# Patient Record
Sex: Male | Born: 1935 | Race: Black or African American | Hispanic: No | Marital: Married | Smoking: Former smoker
Health system: Southern US, Community
[De-identification: ages and names within clinical notes are randomized; demographics above are authoritative.]

## PROBLEM LIST (undated history)

## (undated) DIAGNOSIS — E7211 Homocystinuria: Secondary | ICD-10-CM

## (undated) DIAGNOSIS — C801 Malignant (primary) neoplasm, unspecified: Secondary | ICD-10-CM

## (undated) DIAGNOSIS — E785 Hyperlipidemia, unspecified: Secondary | ICD-10-CM

## (undated) DIAGNOSIS — I639 Cerebral infarction, unspecified: Secondary | ICD-10-CM

## (undated) DIAGNOSIS — I10 Essential (primary) hypertension: Secondary | ICD-10-CM

## (undated) DIAGNOSIS — I509 Heart failure, unspecified: Secondary | ICD-10-CM

## (undated) DIAGNOSIS — I214 Non-ST elevation (NSTEMI) myocardial infarction: Secondary | ICD-10-CM

## (undated) HISTORY — PX: OTHER SURGICAL HISTORY: SHX169

---

## 1998-06-14 ENCOUNTER — Observation Stay (HOSPITAL_COMMUNITY): Admission: EM | Admit: 1998-06-14 | Discharge: 1998-06-15 | Payer: Self-pay | Admitting: *Deleted

## 2003-05-22 ENCOUNTER — Emergency Department (HOSPITAL_COMMUNITY): Admission: EM | Admit: 2003-05-22 | Discharge: 2003-05-23 | Payer: Self-pay | Admitting: Emergency Medicine

## 2005-06-11 ENCOUNTER — Inpatient Hospital Stay (HOSPITAL_COMMUNITY): Admission: EM | Admit: 2005-06-11 | Discharge: 2005-06-13 | Payer: Self-pay | Admitting: Emergency Medicine

## 2005-06-12 ENCOUNTER — Encounter (INDEPENDENT_AMBULATORY_CARE_PROVIDER_SITE_OTHER): Payer: Self-pay | Admitting: *Deleted

## 2005-06-16 ENCOUNTER — Ambulatory Visit (HOSPITAL_COMMUNITY): Admission: RE | Admit: 2005-06-16 | Discharge: 2005-06-16 | Payer: Self-pay | Admitting: Cardiology

## 2005-06-16 ENCOUNTER — Ambulatory Visit: Payer: Self-pay | Admitting: Cardiology

## 2005-09-05 ENCOUNTER — Encounter: Admission: RE | Admit: 2005-09-05 | Discharge: 2005-09-05 | Payer: Self-pay | Admitting: Neurology

## 2006-05-26 ENCOUNTER — Encounter: Admission: RE | Admit: 2006-05-26 | Discharge: 2006-05-26 | Payer: Self-pay | Admitting: Cardiology

## 2007-01-01 ENCOUNTER — Encounter: Admission: RE | Admit: 2007-01-01 | Discharge: 2007-01-01 | Payer: Self-pay | Admitting: Cardiology

## 2010-09-04 ENCOUNTER — Inpatient Hospital Stay (HOSPITAL_COMMUNITY)
Admission: EM | Admit: 2010-09-04 | Discharge: 2010-09-10 | Payer: Self-pay | Source: Home / Self Care | Attending: Cardiology | Admitting: Cardiology

## 2010-09-04 LAB — CBC
HCT: 35.9 % — ABNORMAL LOW (ref 39.0–52.0)
Hemoglobin: 12.1 g/dL — ABNORMAL LOW (ref 13.0–17.0)
MCH: 30.8 pg (ref 26.0–34.0)
MCHC: 33.7 g/dL (ref 30.0–36.0)
MCV: 91.3 fL (ref 78.0–100.0)
Platelets: 173 10*3/uL (ref 150–400)
RBC: 3.93 MIL/uL — ABNORMAL LOW (ref 4.22–5.81)
RDW: 15 % (ref 11.5–15.5)
WBC: 6.6 10*3/uL (ref 4.0–10.5)

## 2010-09-04 LAB — DIFFERENTIAL
Basophils Absolute: 0 10*3/uL (ref 0.0–0.1)
Basophils Relative: 0 % (ref 0–1)
Eosinophils Absolute: 0 10*3/uL (ref 0.0–0.7)
Eosinophils Relative: 1 % (ref 0–5)
Lymphocytes Relative: 13 % (ref 12–46)
Lymphs Abs: 0.9 10*3/uL (ref 0.7–4.0)
Monocytes Absolute: 0.3 10*3/uL (ref 0.1–1.0)
Monocytes Relative: 5 % (ref 3–12)
Neutro Abs: 5.4 10*3/uL (ref 1.7–7.7)
Neutrophils Relative %: 81 % — ABNORMAL HIGH (ref 43–77)

## 2010-09-05 LAB — COMPREHENSIVE METABOLIC PANEL
ALT: 23 U/L (ref 0–53)
AST: 30 U/L (ref 0–37)
Albumin: 3.6 g/dL (ref 3.5–5.2)
Alkaline Phosphatase: 39 U/L (ref 39–117)
BUN: 39 mg/dL — ABNORMAL HIGH (ref 6–23)
CO2: 29 mEq/L (ref 19–32)
Calcium: 8.9 mg/dL (ref 8.4–10.5)
Chloride: 101 mEq/L (ref 96–112)
Creatinine, Ser: 2.65 mg/dL — ABNORMAL HIGH (ref 0.4–1.5)
GFR calc Af Amer: 28 mL/min — ABNORMAL LOW (ref >60)
GFR calc non Af Amer: 23 mL/min — ABNORMAL LOW (ref >60)
Glucose, Bld: 126 mg/dL — ABNORMAL HIGH (ref 70–99)
Potassium: 3.5 mEq/L (ref 3.5–5.1)
Sodium: 142 mEq/L (ref 135–145)
Total Bilirubin: 0.3 mg/dL (ref 0.3–1.2)
Total Protein: 6.9 g/dL (ref 6.0–8.3)

## 2010-09-05 LAB — HEPARIN LEVEL (UNFRACTIONATED): Heparin Unfractionated: 0.56 IU/mL (ref 0.30–0.70)

## 2010-09-05 LAB — URINALYSIS, ROUTINE W REFLEX MICROSCOPIC
Bilirubin Urine: NEGATIVE
Hemoglobin, Urine: NEGATIVE
Ketones, ur: NEGATIVE mg/dL
Nitrite: NEGATIVE
Protein, ur: NEGATIVE mg/dL
Specific Gravity, Urine: 1.017 (ref 1.005–1.030)
Urine Glucose, Fasting: NEGATIVE mg/dL
Urobilinogen, UA: 0.2 mg/dL (ref 0.0–1.0)
pH: 5.5 (ref 5.0–8.0)

## 2010-09-05 LAB — MAGNESIUM: Magnesium: 2.5 mg/dL (ref 1.5–2.5)

## 2010-09-05 LAB — PROTIME-INR
INR: 1.05 (ref 0.00–1.49)
Prothrombin Time: 13.9 seconds (ref 11.6–15.2)

## 2010-09-05 LAB — CK TOTAL AND CKMB (NOT AT ARMC)
CK, MB: 4.6 ng/mL — ABNORMAL HIGH (ref 0.3–4.0)
Relative Index: 2.1 (ref 0.0–2.5)
Total CK: 215 U/L (ref 7–232)

## 2010-09-05 LAB — CARDIAC PANEL(CRET KIN+CKTOT+MB+TROPI)
CK, MB: 6.5 ng/mL (ref 0.3–4.0)
CK, MB: 7.5 ng/mL (ref 0.3–4.0)
Relative Index: 2.5 (ref 0.0–2.5)
Relative Index: 2.3 (ref 0.0–2.5)
Total CK: 265 U/L — ABNORMAL HIGH (ref 7–232)
Total CK: 327 U/L — ABNORMAL HIGH (ref 7–232)
Troponin I: 0.1 ng/mL — ABNORMAL HIGH (ref 0.00–0.06)
Troponin I: 0.08 ng/mL — ABNORMAL HIGH (ref 0.00–0.06)

## 2010-09-05 LAB — LIPID PANEL
Cholesterol: 158 mg/dL (ref 0–200)
HDL: 92 mg/dL (ref >39)
LDL Cholesterol: 62 mg/dL (ref 0–99)
Total CHOL/HDL Ratio: 1.7 RATIO
Triglycerides: 19 mg/dL (ref <150)
VLDL: 4 mg/dL (ref 0–40)

## 2010-09-05 LAB — LIPASE, BLOOD: Lipase: 31 U/L (ref 11–59)

## 2010-09-05 LAB — TROPONIN I: Troponin I: 0.07 ng/mL — ABNORMAL HIGH (ref 0.00–0.06)

## 2010-09-06 LAB — CBC
HCT: 36.8 % — ABNORMAL LOW (ref 39.0–52.0)
Hemoglobin: 12.6 g/dL — ABNORMAL LOW (ref 13.0–17.0)
MCH: 31.4 pg (ref 26.0–34.0)
MCHC: 34.2 g/dL (ref 30.0–36.0)
MCV: 91.8 fL (ref 78.0–100.0)
Platelets: 205 10*3/uL (ref 150–400)
RBC: 4.01 MIL/uL — ABNORMAL LOW (ref 4.22–5.81)
RDW: 15.1 % (ref 11.5–15.5)
WBC: 6.1 10*3/uL (ref 4.0–10.5)

## 2010-09-06 LAB — HEPARIN LEVEL (UNFRACTIONATED): Heparin Unfractionated: 0.64 IU/mL (ref 0.30–0.70)

## 2010-09-16 LAB — CARDIAC PANEL(CRET KIN+CKTOT+MB+TROPI)
CK, MB: 7.6 ng/mL (ref 0.3–4.0)
CK, MB: 6.1 ng/mL (ref 0.3–4.0)
CK, MB: 7 ng/mL (ref 0.3–4.0)
CK, MB: 6.7 ng/mL (ref 0.3–4.0)
Relative Index: 1.8 (ref 0.0–2.5)
Relative Index: 1.7 (ref 0.0–2.5)
Relative Index: 1.7 (ref 0.0–2.5)
Relative Index: 1.9 (ref 0.0–2.5)
Total CK: 415 U/L — ABNORMAL HIGH (ref 7–232)
Total CK: 356 U/L — ABNORMAL HIGH (ref 7–232)
Total CK: 416 U/L — ABNORMAL HIGH (ref 7–232)
Total CK: 346 U/L — ABNORMAL HIGH (ref 7–232)
Troponin I: 0.11 ng/mL — ABNORMAL HIGH (ref 0.00–0.06)
Troponin I: 0.1 ng/mL — ABNORMAL HIGH (ref 0.00–0.06)
Troponin I: 0.1 ng/mL — ABNORMAL HIGH (ref 0.00–0.06)
Troponin I: 0.11 ng/mL — ABNORMAL HIGH (ref 0.00–0.06)

## 2010-09-16 LAB — CBC
HCT: 40.2 % (ref 39.0–52.0)
HCT: 42.5 % (ref 39.0–52.0)
HCT: 36.5 % — ABNORMAL LOW (ref 39.0–52.0)
HCT: 35.7 % — ABNORMAL LOW (ref 39.0–52.0)
Hemoglobin: 13.9 g/dL (ref 13.0–17.0)
Hemoglobin: 14.7 g/dL (ref 13.0–17.0)
Hemoglobin: 12.4 g/dL — ABNORMAL LOW (ref 13.0–17.0)
Hemoglobin: 12.2 g/dL — ABNORMAL LOW (ref 13.0–17.0)
MCH: 31.4 pg (ref 26.0–34.0)
MCH: 31.6 pg (ref 26.0–34.0)
MCH: 30.9 pg (ref 26.0–34.0)
MCH: 31 pg (ref 26.0–34.0)
MCHC: 34.6 g/dL (ref 30.0–36.0)
MCHC: 34.6 g/dL (ref 30.0–36.0)
MCHC: 34 g/dL (ref 30.0–36.0)
MCHC: 34.2 g/dL (ref 30.0–36.0)
MCV: 91 fL (ref 78.0–100.0)
MCV: 91.4 fL (ref 78.0–100.0)
MCV: 91 fL (ref 78.0–100.0)
MCV: 90.8 fL (ref 78.0–100.0)
Platelets: 199 10*3/uL (ref 150–400)
Platelets: 195 10*3/uL (ref 150–400)
Platelets: 181 10*3/uL (ref 150–400)
Platelets: 194 10*3/uL (ref 150–400)
RBC: 4.42 MIL/uL (ref 4.22–5.81)
RBC: 4.65 MIL/uL (ref 4.22–5.81)
RBC: 4.01 MIL/uL — ABNORMAL LOW (ref 4.22–5.81)
RBC: 3.93 MIL/uL — ABNORMAL LOW (ref 4.22–5.81)
RDW: 15.1 % (ref 11.5–15.5)
RDW: 15.2 % (ref 11.5–15.5)
RDW: 15.2 % (ref 11.5–15.5)
RDW: 15.4 % (ref 11.5–15.5)
WBC: 7.5 10*3/uL (ref 4.0–10.5)
WBC: 5.6 10*3/uL (ref 4.0–10.5)
WBC: 5.5 10*3/uL (ref 4.0–10.5)
WBC: 4.6 10*3/uL (ref 4.0–10.5)

## 2010-09-16 LAB — HEPARIN LEVEL (UNFRACTIONATED)
Heparin Unfractionated: 0.43 IU/mL (ref 0.30–0.70)
Heparin Unfractionated: 0.37 IU/mL (ref 0.30–0.70)
Heparin Unfractionated: 0.28 IU/mL — ABNORMAL LOW (ref 0.30–0.70)
Heparin Unfractionated: <0.1 IU/mL — ABNORMAL LOW (ref 0.30–0.70)

## 2010-10-09 NOTE — Discharge Summary (Signed)
Mark Yang, Mark Yang                  ACCOUNT NO.:  [id number]  MEDICAL RECORD NO.:  [id number]          PATIENT TYPE:  INP  LOCATION:  3709                         FACILITY:  [facility]  PHYSICIAN:  Osvaldo Shipper. Jos Cygan, M.D.DATE OF BIRTH:  Apr 04, 1918  DATE OF ADMISSION:  09/04/2010 DATE OF DISCHARGE:  09/10/2010                              DISCHARGE SUMMARY   DISCHARGE DIAGNOSES: 1. Non-ST-segment elevation myocardial infarction. 2. Hypertension. 3. Degenerative joint disease.  Mr. Rana is a 77 year old patient who was seen in the emergency department of the [facility] on September 04, 2010, with a complaint of nausea and vomiting.  The patient has some problems with dementia but stated that he felt okay upon arrival to the emergency department.  His family however said that the patient "looked bad." Evaluation in the emergency department and review of old records revealed a new bradycardia with a slightly low blood pressure.  In the emergency department, his complete blood count was nonacute, the CK-MB revealed total CK to be 215, the CK-MB was 4.5 and the relative index was 2.1.  The comprehensive metabolic panel revealed glucose to be slightly elevated at 126, BUN 39, and the creatinine at 2.6, the lipase was normal.  The troponin was elevated at 0.07.  The chest x-ray revealed some left hemidiaphragm elevation, otherwise nonacute.  The patient was subsequently admitted for a non-Q-wave nonSTEMI.  The electrocardiogram revealed a septal infarct of questionable age, no ST elevation.  There was noted a bradycardia.  The patient was seen by Cardiology.  It was Dr. Verl Dicker opinion that the patient's age and also having had a history of recent stroke that perhaps interventional procedure should be reserved for the patient's comfort only, otherwise, medical management should be done.  The patient was seen by the cardiac rehab team.  The patient showed steady uphill improvement  with no complaint of chest pain.  He did have an episode of sinus tach, but he came back to his baseline rhythm without requiring interventions.  The patient complained of some left leg pain and this was addressed.  Serial markers and EKGs were obtained, and there was no severe progression of acuity.  On September 10, 2010, it was the opinion that the patient had received maximum benefit from this hospitalization and could be discharged home. Plans were made for home health intervention.  The patient is to be seen in the office in 2 weeks or sooner if any changes, problems, or concerns.  MEDICATIONS AT DISCHARGE: 1. Tylenol 325 mg one every 6 hours as needed. 2. Aspirin 81 mg daily. 3. Clonidine 0.1 mg daily. 4. Nitroglycerin 0.4 mg sublingual every 5 minutes as needed up to 3     tablets. 5. Aggrenox one twice a day. 6. Aricept 5 mg daily. 7. Ativan 1 mg three times daily as needed. 8. Lasix 40 mg daily for swelling as needed. 9. Namenda 10 mg twice daily. 10.Zocor 20 mg daily.  The patient is to notify the physician immediately if any changes, problems, or concerns.     Ivery Quale, P.A.   ______________________________ Osvaldo Shipper. Amora Sheehy,  M.D.    HB/MEDQ  D:  09/26/2010  T:  09/26/2010  Job:  [id number]  Electronically Signed by Ivery Quale P.A. on 10/01/2010 01:31:11 PM Electronically Signed by Donia Guiles M.D. on 10/09/2010 07:25:48 PM

## 2011-01-17 NOTE — H&P (Signed)
NAMEKEVONTAY, BURKS                  ACCOUNT NO.:  [id number]   MEDICAL RECORD NO.:  [id number]          PATIENT TYPE:  EMS   LOCATION:  MAJO                         FACILITY:  [facility]   PHYSICIAN:  Bevelyn Buckles. Champey, M.D.DATE OF BIRTH:  [birth year]   DATE OF ADMISSION:  06/11/2005  DATE OF DISCHARGE:                                HISTORY & PHYSICAL   REFERRING PHYSICIAN:  Lavonda Jumbo, M.D.   CHIEF COMPLAINT:  Stroke.   HISTORY OF PRESENT ILLNESS:  Mr. Puccinelli is a 77 year old African-American  male with past medical history of hypertension who presents with a couple  day history of not feeling well, not being able to move his body well,  having some speech difficulty, and some visual disturbance, mostly blurred  vision.  The patient also has felt generalized fatigue and had slight  confusion over the past couple of days.  He has no problems with  comprehension of speech or conversation.  On occasion, he will have  difficulty getting words out.  The patient denies any headaches, focal  weakness, numbness, swelling, problems with vertigo, or loss of  consciousness.   PAST MEDICAL HISTORY:  Positive for hypertension.   MEDICATIONS:  Primatene/hydrochlorothiazide 37.5/25 one tablet per day.   ALLERGIES:  No known drug allergies.   FAMILY HISTORY:  He currently lives with his wife and son.   REVIEW OF SYSTEMS:  Positive as per HPI.  Otherwise negative as per HPI.  Greater than seven other organ systems.   PHYSICAL EXAMINATION:  VITAL SIGNS:  Blood pressure 158/76, pulse 55.  HEENT:  Normocephalic and atraumatic.  Extraocular muscles are intact.  Pupils equal, round, and reactive to light.  Face is symmetric.  HEART:  Regular.  LUNGS:  Clear.  ABDOMEN:  Soft and nontender.  EXTREMITIES:  Good pulses.  NEUROLOGY:  The patient is alert and oriented x2, not to year which he  thinks is 77.  Language is fine.  Cranial nerves II-XII grossly intact.  Motor examination shows 4+/5  strength throughout and normal tone throughout.  No drift is noted.  Sensory examination is within normal limits to light  touch and pinprick throughout. Reflexes are trace to 1+ throughout.  The  toes are downgoing bilaterally.  Cerebellar function is within normal  limits.  Finger-to-nose.  Gait was not assessed secondary to safety.   CT scan of the head showed a left acute PCA infarct with questionable small  amount of blood with old left parietal infarct.   LABORATORY DATA:  WBC 6.2, hemoglobin 16.3, hematocrit 40.2, platelets 239.  PT 13.8, INR 1.0, PTT 28.  Sodium 135, potassium 3.5, chloride 104, BUN 19,  creatinine 1.4, glucose 109.  Myoglobin 4.55.  CK 5.5, troponin is less than  0.05.   IMPRESSION:  This is a 77 year old with an acute left PCA infarct.  We will  admit the patient to stroke service, do an MRI and MRA of the brain, check  carotid Dopplers and two-dimensional echocardiogram.  We will check his  fasting lipids and homocysteine levels.  We will keep the patient  NPO until  speech clears him and place the patient on IV fluids at 100 mL an hour.  Get  PT, OT, and speech consults.  We will continue his current medications and  place the patient on DVT prophylaxis.           ______________________________  Bevelyn Buckles. Nash Shearer, M.D.     DRC/MEDQ  D:  06/11/2005  T:  06/12/2005  Job:  [id number]

## 2011-01-17 NOTE — Discharge Summary (Signed)
Mark Yang, Mark Yang                  ACCOUNT NO.:  [id number]   MEDICAL RECORD NO.:  [id number]          PATIENT TYPE:  INP   LOCATION:  3004                         FACILITY:  [facility]   PHYSICIAN:  Pramod P. Pearlean Brownie, MD    DATE OF BIRTH:  10/27/1917   DATE OF ADMISSION:  06/11/2005  DATE OF DISCHARGE:  06/15/2005                                 DISCHARGE SUMMARY   DISCHARGE DIAGNOSES:  1.  Left posterior cerebral artery infarct, likely intracranial      atherosclerosis.  2.  Hypertension.  3.  Dyslipidemia.  4.  Hyperhomocysteinemia.  5.  Cigarette smoking.   DISCHARGE MEDICATIONS:  1.  Clonidine 0.2 mg twice daily.  2.  Triamterene/hydrochlorothiazide 37.5/25 mg daily.  3.  Zocor 20 mg daily.  4.  Foltx 1 daily.  5.  Aggrenox 1 p.o. daily x2 weeks, then increase to twice daily.   STUDIES:  1.  CT of the brain on admission showed a moderate to large acute possibly      minimally hemorrhagic with left occipital infarct with adjacent local      mass effect.  Moderate infarct parietal lobe with encephalomalacia.      Atrophy.  Small vessel disease. Nonspecific bony lesion left frontal      calvarium and left frontal calvarium.  2.  MRI of the brain shows a medial left occipital lobe infarct consistent      with PCA territory.  Additional punctate areas of restrictive effusion      the left thalamus. Remote infarct left parietal lobe.  Atrophy      appropriate for patient's age.  Focal area within the area of the left      occipital lobe infarct may represent a tiny focus of subarachnoid blood      versus calcification.  3.  MRA of the brain shows left anterior cerebral artery P2 occlusion,      attenuated flow within the distal right PCA and right MCA branches.  4.  EKG shows sinus bradycardia.  5.  Carotid Doppler is normal.  6.  Transcranial Doppler not performed.  7.  2-D echocardiogram with EF 50 to 55%, no embolic source.  8.  Laboratory studies: Cholesterol 212,  triglycerides 88, HDL 80, LDL 114.      Homocysteine 25.15.  Urinalysis: Small bilirubin, otherwise normal.      CBC: Normal except for mildly elevated RDW at 15. Differential normal.      Chemistries: Glucose 109, creatinine 1.4.  Myoglobin high on admission      in the 450 to 500 range x3.  Otherwise, cardiac enzymes normal.   HISTORY OF PRESENT ILLNESS:  Mark Yang is an 77 year old, right-handed,  African-American male with history of hypertension who presents with a  couple of days history of not feeling well, not being able to move his body  without having speech difficulty as well as visual disturbance. The patient  has also felt fatigued and had confusion over the past few days.  He has had  no problems with comprehension, speech, or understanding. He occasionally  will have difficulty getting words out.  Neurology was asked to see him in  the emergency room as CT showed an infarct. The patient was admitted to the  hospital for further stroke evaluation.   HOSPITAL COURSE:  MRI did reveal acute left PCA infarct and small thalamic  infarct.  Stroke was felt to be atherosclerotic from risk factors of  dyslipidemia, hyperhomocysteinemia, smoking, and hypertension.  There was  some concern for cardioembolic source and will schedule patient for an  outpatient TEE the week after discharge.  The patient definitely has some  left visual field cut and mild weakness for which PT, OT, and speech therapy  are recommended at home.  He lives with his wife and son and plans to return  there.  Foltx and Zocor were added for risk factors of dyslipidemia and  hyperhomocysteinemia. The patient was placed on Aggrenox for secondary  stroke prevention.   CONDITION ON DISCHARGE:  Alert and oriented to person and place, occasional  confusion. He has fluent speech with occasional word hesitancy.  He is not  using paraphasias or neologisms.  He does have a dense right homonymous  hemianopsia.  He has  no facial oxymetry. He has no upper extremity drift and  no focal weakness in his lower extremities.   DISCHARGE PLAN:  1.  Discharge home to care of wife and son.  2.  Home health PT, OT, and speech therapies.  3.  Aggrenox for secondary stroke prevention.  4.  Outpatient TEE arranged with [facility] for week following      discharge.  5.  The patient is instructed not to drive.  6.  Follow up with Dr. Pearlean Brownie in 2 to 3 months.  7.  Follow up with primary care physician in 1 month.      Annie Main, N.P.    ______________________________  [name], MD    SB/MEDQ  D:  06/13/2005  T:  06/13/2005  Job:  [id number]   cc:   Osvaldo Shipper. Spruill, M.D.  Fax: [phone]   [facility]

## 2011-11-14 IMAGING — CR DG TIBIA/FIBULA 2V*L*
4 series · 4 of 4 positions shown · non-contrast
Comparison: None

CLINICAL DATA: Left lower leg pain.

LEFT TIBIA AND FIBULA - 2 VIEW

[t tib/fib ap left (1 of 2)]
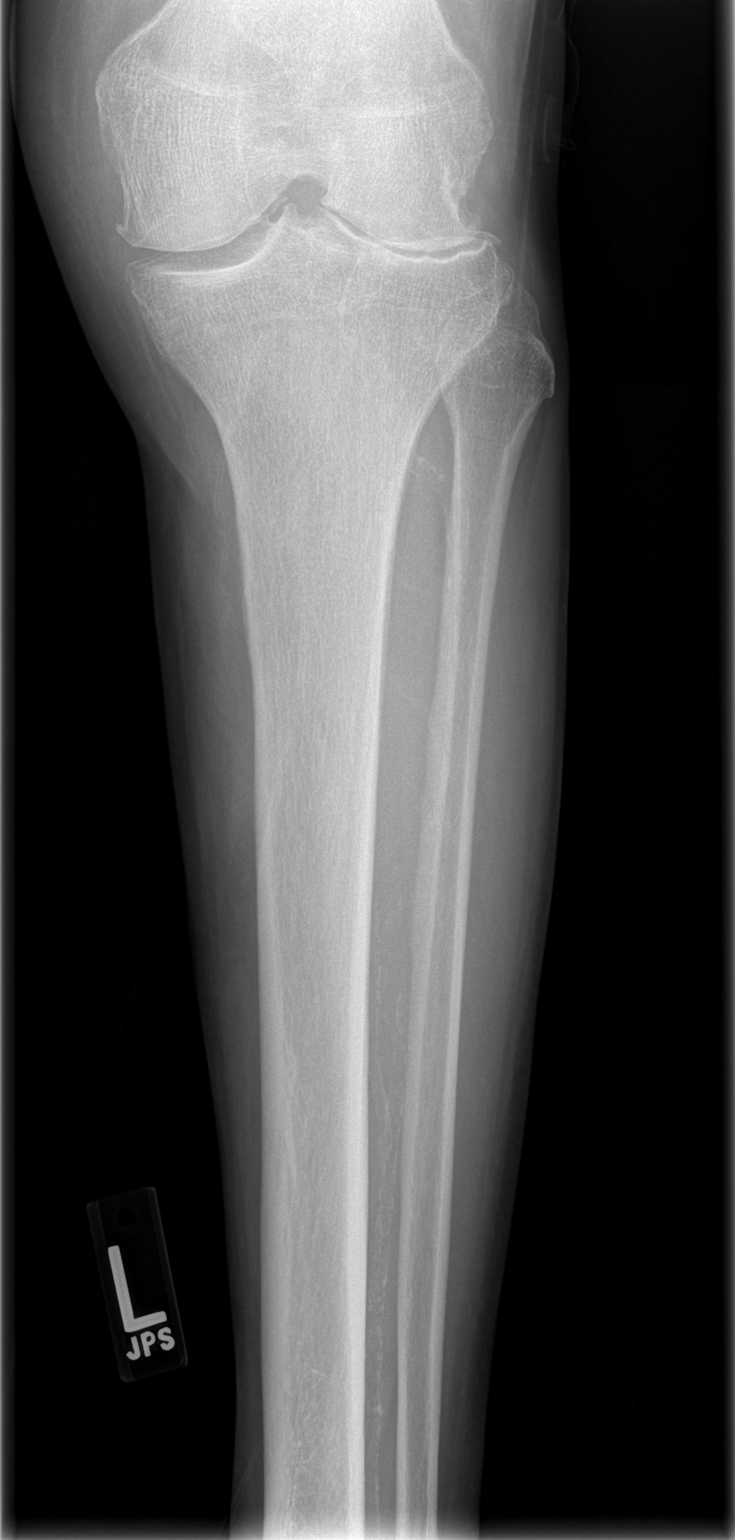

[t tib/fib ap left (2 of 2)]
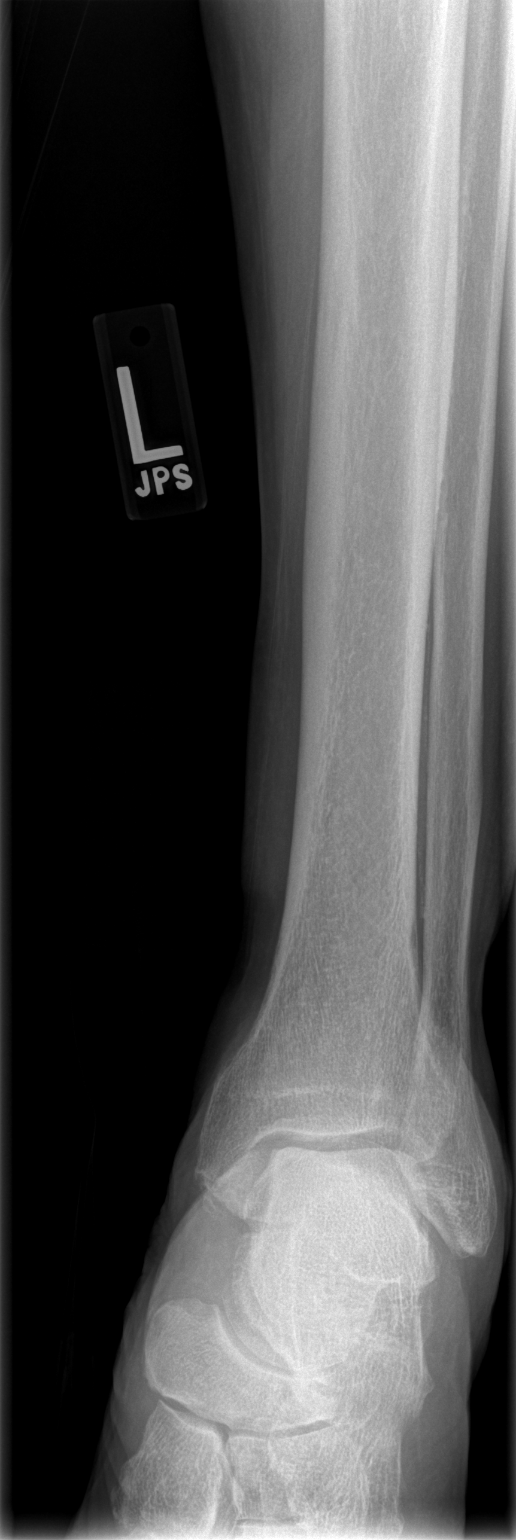

[t tib/fib lat left (1 of 2)]
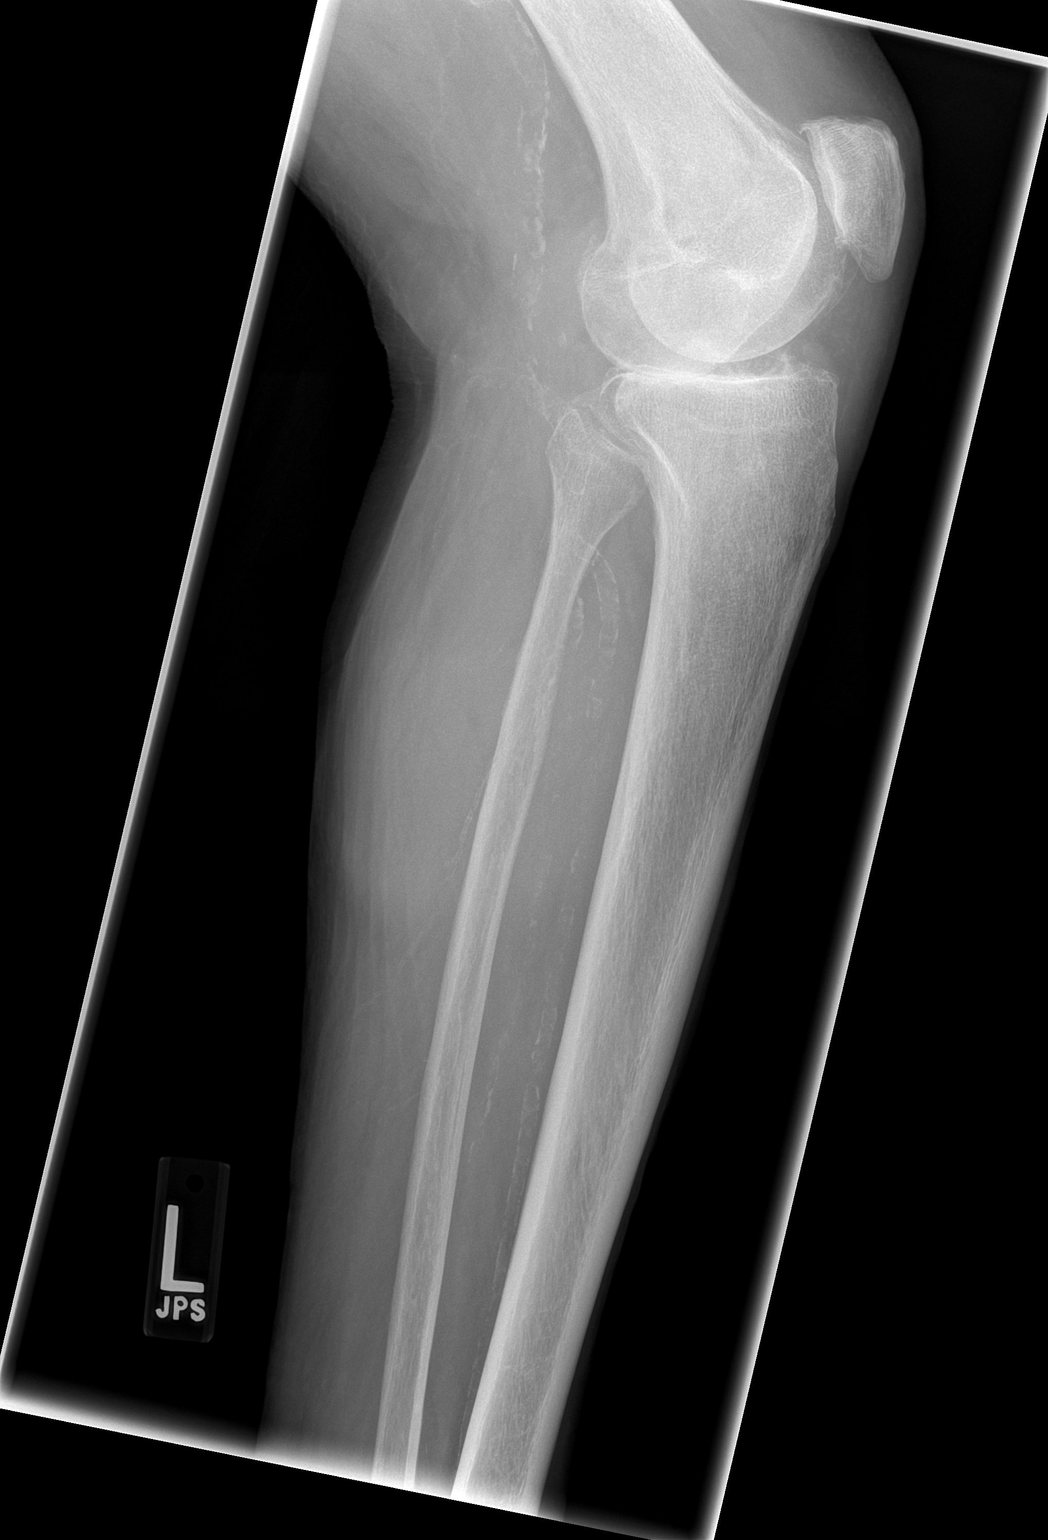

[t tib/fib lat left (2 of 2)]
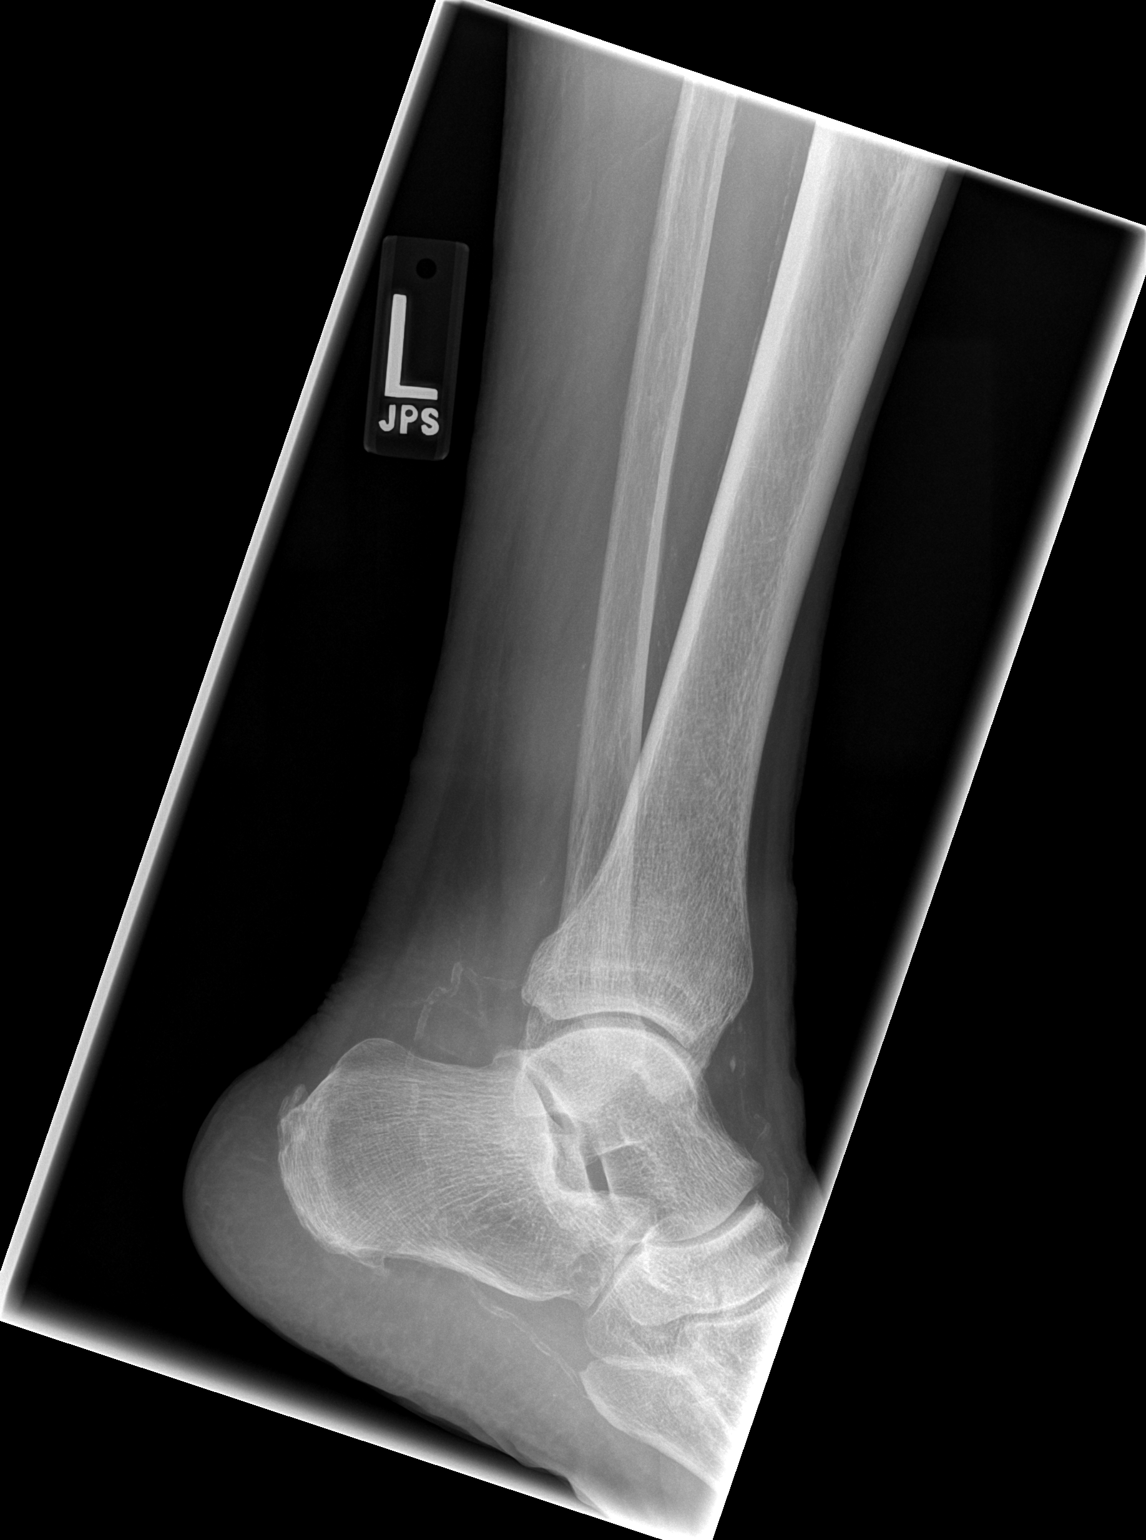

[4 of 4 positions shown; findings below may reference images not displayed]

FINDINGS: Moderately advanced lateral compartment degenerative
change noted at the knee.  Vascular calcifications are noted.  No
acute bony findings or destructive bony changes.
IMPRESSION: 1.  No acute bony findings.
2.  Extensive arterial calcifications.
3.  Significant lateral compartment degenerative change at the
knee.

## 2012-07-22 ENCOUNTER — Other Ambulatory Visit: Payer: Self-pay | Admitting: Cardiovascular Disease

## 2012-07-22 DIAGNOSIS — R319 Hematuria, unspecified: Secondary | ICD-10-CM

## 2012-07-23 ENCOUNTER — Ambulatory Visit
Admission: RE | Admit: 2012-07-23 | Discharge: 2012-07-23 | Disposition: A | Discharge status: Home / Self Care | Payer: No Typology Code available for payment source | Source: Ambulatory Visit | Attending: Cardiovascular Disease | Admitting: Cardiovascular Disease

## 2012-07-23 DIAGNOSIS — R319 Hematuria, unspecified: Secondary | ICD-10-CM

## 2013-05-19 ENCOUNTER — Other Ambulatory Visit: Payer: Self-pay | Admitting: Urology

## 2013-06-09 ENCOUNTER — Ambulatory Visit (HOSPITAL_COMMUNITY)
Admission: RE | Admit: 2013-06-09 | Payer: No Typology Code available for payment source | Source: Ambulatory Visit | Admitting: Urology

## 2013-06-09 ENCOUNTER — Encounter (HOSPITAL_COMMUNITY): Admission: RE | Payer: Self-pay | Source: Ambulatory Visit

## 2013-06-09 SURGERY — TRANSURETHRAL RESECTION OF BLADDER TUMOR WITH GYRUS (TURBT-GYRUS)
Anesthesia: Choice

## 2013-06-14 ENCOUNTER — Other Ambulatory Visit: Payer: Self-pay | Admitting: Urology

## 2013-07-04 ENCOUNTER — Ambulatory Visit (HOSPITAL_COMMUNITY)
Admission: RE | Admit: 2013-07-04 | Payer: No Typology Code available for payment source | Source: Ambulatory Visit | Admitting: Urology

## 2013-07-04 ENCOUNTER — Encounter (HOSPITAL_COMMUNITY): Admission: RE | Payer: Self-pay | Source: Ambulatory Visit

## 2013-07-04 SURGERY — TRANSURETHRAL RESECTION OF BLADDER TUMOR WITH GYRUS (TURBT-GYRUS)
Anesthesia: Choice

## 2014-05-20 ENCOUNTER — Inpatient Hospital Stay (HOSPITAL_COMMUNITY)
Admission: EM | Admit: 2014-05-20 | Discharge: 2014-05-22 | DRG: 670 | Disposition: A | Discharge status: Home / Self Care | Payer: Medicare Other | Attending: Urology | Admitting: Urology

## 2014-05-20 ENCOUNTER — Emergency Department (HOSPITAL_COMMUNITY): Payer: Medicare Other

## 2014-05-20 ENCOUNTER — Encounter (HOSPITAL_COMMUNITY): Payer: Self-pay | Admitting: Emergency Medicine

## 2014-05-20 DIAGNOSIS — R339 Retention of urine, unspecified: Secondary | ICD-10-CM

## 2014-05-20 DIAGNOSIS — C675 Malignant neoplasm of bladder neck: Secondary | ICD-10-CM

## 2014-05-20 DIAGNOSIS — R319 Hematuria, unspecified: Secondary | ICD-10-CM | POA: Diagnosis present

## 2014-05-20 DIAGNOSIS — I10 Essential (primary) hypertension: Secondary | ICD-10-CM | POA: Diagnosis present

## 2014-05-20 DIAGNOSIS — Z8673 Personal history of transient ischemic attack (TIA), and cerebral infarction without residual deficits: Secondary | ICD-10-CM

## 2014-05-20 DIAGNOSIS — Z8546 Personal history of malignant neoplasm of prostate: Secondary | ICD-10-CM

## 2014-05-20 DIAGNOSIS — E785 Hyperlipidemia, unspecified: Secondary | ICD-10-CM | POA: Diagnosis present

## 2014-05-20 DIAGNOSIS — I509 Heart failure, unspecified: Secondary | ICD-10-CM | POA: Diagnosis present

## 2014-05-20 DIAGNOSIS — Z79899 Other long term (current) drug therapy: Secondary | ICD-10-CM

## 2014-05-20 DIAGNOSIS — I252 Old myocardial infarction: Secondary | ICD-10-CM

## 2014-05-20 HISTORY — DX: Cerebral infarction, unspecified: I63.9

## 2014-05-20 HISTORY — DX: Homocystinuria: E72.11

## 2014-05-20 HISTORY — DX: Hyperlipidemia, unspecified: E78.5

## 2014-05-20 HISTORY — DX: Non-ST elevation (NSTEMI) myocardial infarction: I21.4

## 2014-05-20 HISTORY — DX: Heart failure, unspecified: I50.9

## 2014-05-20 HISTORY — DX: Essential (primary) hypertension: I10

## 2014-05-20 LAB — BASIC METABOLIC PANEL
Anion gap: 15 (ref 5–15)
BUN: 23 mg/dL (ref 6–23)
CO2: 25 meq/L (ref 19–32)
CREATININE: 1.3 mg/dL (ref 0.50–1.35)
Calcium: 9 mg/dL (ref 8.4–10.5)
Chloride: 103 mEq/L (ref 96–112)
GFR calc non Af Amer: 46 mL/min — ABNORMAL LOW (ref >90)
GFR, EST AFRICAN AMERICAN: 53 mL/min — AB (ref >90)
GLUCOSE: 137 mg/dL — AB (ref 70–99)
POTASSIUM: 3.7 meq/L (ref 3.7–5.3)
Sodium: 143 mEq/L (ref 137–147)

## 2014-05-20 LAB — CBC WITH DIFFERENTIAL/PLATELET
Basophils Absolute: 0 10*3/uL (ref 0.0–0.1)
Basophils Relative: 0 % (ref 0–1)
Eosinophils Absolute: 0 10*3/uL (ref 0.0–0.7)
Eosinophils Relative: 0 % (ref 0–5)
HEMATOCRIT: 33.6 % — AB (ref 39.0–52.0)
HEMOGLOBIN: 10.7 g/dL — AB (ref 13.0–17.0)
LYMPHS ABS: 0.4 10*3/uL — AB (ref 0.7–4.0)
LYMPHS PCT: 6 % — AB (ref 12–46)
MCH: 26.8 pg (ref 26.0–34.0)
MCHC: 31.8 g/dL (ref 30.0–36.0)
MCV: 84.2 fL (ref 78.0–100.0)
MONO ABS: 0.2 10*3/uL (ref 0.1–1.0)
Monocytes Relative: 3 % (ref 3–12)
NEUTROS ABS: 6.2 10*3/uL (ref 1.7–7.7)
NEUTROS PCT: 91 % — AB (ref 43–77)
Platelets: 238 10*3/uL (ref 150–400)
RBC: 3.99 MIL/uL — AB (ref 4.22–5.81)
RDW: 16.9 % — ABNORMAL HIGH (ref 11.5–15.5)
WBC: 6.8 10*3/uL (ref 4.0–10.5)

## 2014-05-20 LAB — PROTIME-INR
INR: 1.08 (ref 0.00–1.49)
Prothrombin Time: 14 seconds (ref 11.6–15.2)

## 2014-05-20 LAB — URINALYSIS, ROUTINE W REFLEX MICROSCOPIC
BILIRUBIN URINE: NEGATIVE
GLUCOSE, UA: NEGATIVE mg/dL
Ketones, ur: NEGATIVE mg/dL
Leukocytes, UA: NEGATIVE
Nitrite: NEGATIVE
Protein, ur: >300 mg/dL — AB
Specific Gravity, Urine: 1.018 (ref 1.005–1.030)
UROBILINOGEN UA: 1 mg/dL (ref 0.0–1.0)
pH: 6 (ref 5.0–8.0)

## 2014-05-20 LAB — URINE MICROSCOPIC-ADD ON

## 2014-05-20 MED ORDER — FERROUS SULFATE 325 (65 FE) MG PO TABS
325.0000 mg | ORAL_TABLET | Freq: Every day | ORAL | Status: DC
  Administered 2014-05-22: 325 mg via ORAL
  Filled 2014-05-20 (×3): qty 1

## 2014-05-20 MED ORDER — LORAZEPAM 1 MG PO TABS: 1.0000 mg | ORAL_TABLET | Freq: Every day | ORAL | Status: DC

## 2014-05-20 MED ORDER — CIPROFLOXACIN IN D5W 200 MG/100ML IV SOLN
200.0000 mg | Freq: Two times a day (BID) | INTRAVENOUS | Status: DC
  Administered 2014-05-20: 200 mg via INTRAVENOUS
  Filled 2014-05-20 (×2): qty 100

## 2014-05-20 MED ORDER — FUROSEMIDE 40 MG PO TABS
40.0000 mg | ORAL_TABLET | Freq: Every day | ORAL | Status: DC
  Administered 2014-05-20 – 2014-05-22 (×2): 40 mg via ORAL
  Filled 2014-05-20 (×3): qty 1

## 2014-05-20 MED ORDER — STERILE WATER FOR INJECTION IJ SOLN
INTRAMUSCULAR | Status: AC
  Administered 2014-05-20: 15:00:00
  Filled 2014-05-20: qty 30

## 2014-05-20 MED ORDER — CLONIDINE HCL 0.1 MG PO TABS
0.1000 mg | ORAL_TABLET | Freq: Every day | ORAL | Status: DC
  Administered 2014-05-20 – 2014-05-22 (×2): 0.1 mg via ORAL
  Filled 2014-05-20 (×3): qty 1

## 2014-05-20 MED ORDER — DONEPEZIL HCL 5 MG PO TABS
5.0000 mg | ORAL_TABLET | Freq: Every day | ORAL | Status: DC
Start: 2014-05-20 — End: 2014-05-22
  Administered 2014-05-20 – 2014-05-21 (×2): 5 mg via ORAL
  Filled 2014-05-20 (×3): qty 1

## 2014-05-20 MED ORDER — ADULT MULTIVITAMIN W/MINERALS CH
1.0000 | ORAL_TABLET | Freq: Every day | ORAL | Status: DC
  Administered 2014-05-20 – 2014-05-22 (×2): 1 via ORAL
  Filled 2014-05-20 (×3): qty 1

## 2014-05-20 MED ORDER — DOCUSATE SODIUM 100 MG PO CAPS
100.0000 mg | ORAL_CAPSULE | Freq: Two times a day (BID) | ORAL | Status: DC
  Administered 2014-05-20 – 2014-05-22 (×3): 100 mg via ORAL
  Filled 2014-05-20 (×5): qty 1

## 2014-05-20 MED ORDER — BELLADONNA ALKALOIDS-OPIUM 16.2-60 MG RE SUPP
1.0000 | Freq: Once | RECTAL | Status: AC
  Administered 2014-05-20: 1 via RECTAL
  Filled 2014-05-20: qty 1

## 2014-05-20 MED ORDER — MEMANTINE HCL ER 7 MG PO CP24
28.0000 mg | ORAL_CAPSULE | Freq: Every day | ORAL | Status: DC
  Administered 2014-05-20 – 2014-05-22 (×2): 28 mg via ORAL
  Filled 2014-05-20 (×3): qty 4

## 2014-05-20 MED ORDER — ACETAMINOPHEN 325 MG PO TABS: 650.0000 mg | ORAL_TABLET | ORAL | Status: DC | PRN

## 2014-05-20 MED ORDER — CETYLPYRIDINIUM CHLORIDE 0.05 % MT LIQD
7.0000 mL | Freq: Two times a day (BID) | OROMUCOSAL | Status: DC
  Administered 2014-05-20 – 2014-05-22 (×3): 7 mL via OROMUCOSAL

## 2014-05-20 MED ORDER — SIMVASTATIN 20 MG PO TABS
20.0000 mg | ORAL_TABLET | Freq: Every day | ORAL | Status: DC
Start: 2014-05-20 — End: 2014-05-22
  Administered 2014-05-20 – 2014-05-21 (×2): 20 mg via ORAL
  Filled 2014-05-20 (×3): qty 1

## 2014-05-20 NOTE — Consult Note (Signed)
Urology Consult   Physician requesting consult: Dr. Audie Pinto, ED  Reason for consult: Hematuria, urinary retention  History of Present Illness: Mark Yang is a 80 y.o. 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 Diona Fanti. A TURBT was recommended at that time for palliative purposes, but the family elected to forgo that given the patient's age and frailty.   He presents today with subrapubic discomfort and inability to urinate. He had been passing clots for the last few days and was occasionally straining, but he was able to get his stream going up until this morning. Bedside ultrasound in the ED revealed either a mass or clot in the bladder. A 22Fr 3 way foley catheter was placed and 312mL of red urine returned without clots. Unable to irrigate catheter so urology was consulted.   The catheter was replaced with a 24Fr large mouth coude hematuria catheter and irrigated to clear. His bladder capacity is extremely small with inability to get more than 60cc in his bladder before becoming uncomfortable. No clots could be irrigated and he was able to be cleared about about 1L of irrigation, however the drainage became red rather quickly.   Past Medical History  Diagnosis Date  . CHF (congestive heart failure)   . NSTEMI (non-ST elevated myocardial infarction)   . Hypertension   . Stroke   . Dyslipidemia   . Hyperhomocysteinemia   Prostate cancer Bladder cancer  No past surgical history on file.  Medications:  Home meds:    Medication List    ASK your doctor about these medications       cloNIDine 0.1 MG tablet  Commonly known as:  CATAPRES  Take 0.1 mg by mouth daily.     donepezil 5 MG tablet  Commonly known as:  ARICEPT  Take 5 mg by mouth at bedtime.     ferrous sulfate 325 (65 FE) MG tablet  Take 325  mg by mouth daily with breakfast.     furosemide 40 MG tablet  Commonly known as:  LASIX  Take 40 mg by mouth daily.     LORazepam 1 MG tablet  Commonly known as:  ATIVAN  Take 1 mg by mouth daily.     multivitamin with minerals Tabs tablet  Take 1 tablet by mouth daily.     NAMENDA XR 28 MG Cp24  Generic drug:  Memantine HCl ER  Take by mouth.     simvastatin 20 MG tablet  Commonly known as:  ZOCOR  Take 20 mg by mouth daily.     STOOL SOFTENER PO  Take 1 capsule by mouth daily as needed (constipation.).     VITAMIN C PO  Take 1 tablet by mouth daily.        Scheduled Meds: Continuous Infusions: PRN Meds:.    Allergies: No Known Allergies  No family history on file.  Social History:  has no tobacco, alcohol, and drug history on file.  ROS: A complete review of systems was performed.  All systems are negative except for pertinent findings as noted.  Physical Exam:  Vital signs in last 24 hours: Temp:  [97.4 F (36.3 C)] 97.4 F (36.3 C) (09/19 1301) Pulse Rate:  [83] 83 (09/19 1301) Resp:  [18] 18 (09/19 1301) BP: (173)/(76) 173/76 mmHg (09/19 1301) SpO2:  [97 %] 97 % (09/19 1301)  Constitutional:  Alert and oriented, No acute distress Cardiovascular: Regular rate and rhythm, No JVD Respiratory: Normal respiratory effort, Lungs clear bilaterally GI: Abdomen is soft, nontender, nondistended, no abdominal masses Genitourinary: No CVAT. Normal circumcised. male phallus, testes are descended bilaterally and non-tender and without masses, scrotum is normal in appearance without lesions or masses, perineum is normal on inspection. Urine as above in HPI. Lymphatic: No lymphadenopathy Neurologic: Grossly intact, no focal deficits Psychiatric: Normal mood and affect  Laboratory Data:   Recent Labs  05/20/14 1603  WBC 6.8  HGB 10.7*  HCT 33.6*  PLT 238    No results found for this basename: NA, K, CL, CO3, GLUCOSE, BUN, CALCIUM, CREATININE,  in the last  72 hours   Results for orders placed during the hospital encounter of 05/20/14 (from the past 24 hour(s))  URINALYSIS, ROUTINE W REFLEX MICROSCOPIC     Status: Abnormal   Collection Time    05/20/14  2:14 PM      Result Value Ref Range   Color, Urine YELLOW  YELLOW   APPearance CLOUDY (*) CLEAR   Specific Gravity, Urine 1.018  1.005 - 1.030   pH 6.0  5.0 - 8.0   Glucose, UA NEGATIVE  NEGATIVE mg/dL   Hgb urine dipstick LARGE (*) NEGATIVE   Bilirubin Urine NEGATIVE  NEGATIVE   Ketones, ur NEGATIVE  NEGATIVE mg/dL   Protein, ur >300 (*) NEGATIVE mg/dL   Urobilinogen, UA 1.0  0.0 - 1.0 mg/dL   Nitrite NEGATIVE  NEGATIVE   Leukocytes, UA NEGATIVE  NEGATIVE  URINE MICROSCOPIC-ADD ON     Status: None   Collection Time    05/20/14  2:14 PM      Result Value Ref Range   RBC / HPF TOO NUMEROUS TO COUNT  <3 RBC/hpf   Urine-Other FIELD OBSCURED BY RBC'S     No results found for this or any previous visit (from the past 240 hour(s)).  Renal Function: No results found for this basename: CREATININE,  in the last 168 hours CrCl is unknown because there is no height on file for the current visit.  Radiologic Imaging: No results found.  I independently reviewed the above imaging studies.  Impression/Recommendation: 24M with known history of gross hematuria and possible clot retention vs obstructing tumor. He has a history of prostate cancer treated with brachytherapy in 2007 and a known bladder tumor at the bladder neck as recently as 05/2013 seen on cystoscopy but the bladder could not be fully evaluated due to hematuria. Family hoping to avoid OR if at all possible.   -- admit to urology for observation -- f/u formal bladder ultrasound - no significant clot/mass on preliminary read. No hydronephrosis -- gentle CBI overnight -- will decide on palliative clot evac and TURBT vs continued foley drainage  Addendum  I performed a history and physical examination of the patient and discussed  his management with the resident.  I reviewed the resident's note and agree with the documented findings and plan of care.  I spoke at length with the patient's daughter. The patient's attending urologist will be away next week. They will have to make a decision with the patient as to whether he would be agreeable to any surgical intervention. If he persists in having hematuria we would suggest consideration for cystoscopy with possible TURBT and fulguration next week. If no intervention is accepted then we would want to get a palliative care consultation and since he cannot just stay in the  hospital having ongoing hematuria. We will reassess the degree hematuria in the a.m. If it is acceptable for discharge we'll send him home probably with a Foley catheter. If he has ongoing hematuria we will discuss further the possibility of surgical intervention on Monday.

## 2014-05-20 NOTE — ED Provider Notes (Signed)
CSN: [id number]     Arrival date & time 05/20/14  1253 History   First MD Initiated Contact with Patient 05/20/14 1307     Chief Complaint  Patient presents with  . Urinary Retention      HPI Pt in from home. Has cyst on bladder. No surgery done due to age. Normally has blood in urine. Did pass a blood clot just PTA but still unable to urinate. Hx CHF, lower extremity edema, worse today according to family.  Past Medical History  Diagnosis Date  . CHF (congestive heart failure)   . NSTEMI (non-ST elevated myocardial infarction)   . Hypertension   . Stroke   . Dyslipidemia   . Hyperhomocysteinemia    No past surgical history on file. No family history on file. History  Substance Use Topics  . Smoking status: Not on file  . Smokeless tobacco: Not on file  . Alcohol Use: Not on file    Review of Systems  Unable to perform ROS: Dementia      Allergies  Review of patient's allergies indicates no known allergies.  Home Medications   Prior to Admission medications   Medication Sig Start Date End Date Taking? Authorizing Provider  Ascorbic Acid (VITAMIN C PO) Take 1 tablet by mouth daily.   Yes Historical Provider, MD  cloNIDine (CATAPRES) 0.1 MG tablet Take 0.1 mg by mouth daily.   Yes Historical Provider, MD  Docusate Calcium (STOOL SOFTENER PO) Take 1 capsule by mouth daily as needed (constipation.).   Yes Historical Provider, MD  donepezil (ARICEPT) 5 MG tablet Take 5 mg by mouth at bedtime.   Yes Historical Provider, MD  ferrous sulfate 325 (65 FE) MG tablet Take 325 mg by mouth daily with breakfast.   Yes Historical Provider, MD  furosemide (LASIX) 40 MG tablet Take 40 mg by mouth daily.   Yes Historical Provider, MD  LORazepam (ATIVAN) 1 MG tablet Take 1 mg by mouth daily.   Yes Historical Provider, MD  Memantine HCl ER (NAMENDA XR) 28 MG CP24 Take by mouth.   Yes Historical Provider, MD  Multiple Vitamin (MULTIVITAMIN WITH MINERALS) TABS tablet Take 1 tablet by  mouth daily.   Yes Historical Provider, MD  simvastatin (ZOCOR) 20 MG tablet Take 20 mg by mouth daily.   Yes Historical Provider, MD   BP 126/66  Pulse 60  Temp(Src) 98.4 F (36.9 C) (Oral)  Resp 20  Ht 5\' 9"  (1.753 m)  Wt 147 lb 14.9 oz (67.1 kg)  BMI 21.84 kg/m2  SpO2 98% Physical Exam  Nursing note and vitals reviewed. Constitutional: He appears well-developed and well-nourished. No distress.  HENT:  Head: Normocephalic and atraumatic.  Eyes: Pupils are equal, round, and reactive to light.  Neck: Normal range of motion.  Cardiovascular: Normal rate.   Pulmonary/Chest: No respiratory distress.  Abdominal: Normal appearance. He exhibits distension. There is tenderness (Over the bladder).  Musculoskeletal: Normal range of motion.  Neurological: He is alert. No cranial nerve deficit.  Skin: Skin is warm and dry. No rash noted.  Psychiatric: He has a normal mood and affect.    ED Course  Procedures (including critical care time) Foley catheter placed and proximally 500-600 cc of bloody urine was drained.  Urology was consulted and saw the patient emergency room..  The catheter.  Recommended lab work and ultrasound done.  They will reevaluate after results have returned. Labs Review Labs Reviewed  URINALYSIS, ROUTINE W REFLEX MICROSCOPIC - Abnormal; Notable for the  following:    Color, Urine RED (*)    APPearance CLOUDY (*)    Hgb urine dipstick LARGE (*)    Protein, ur >300 (*)    All other components within normal limits  CBC WITH DIFFERENTIAL - Abnormal; Notable for the following:    RBC 3.99 (*)    Hemoglobin 10.7 (*)    HCT 33.6 (*)    RDW 16.9 (*)    Neutrophils Relative % 91 (*)    Lymphocytes Relative 6 (*)    Lymphs Abs 0.4 (*)    All other components within normal limits  BASIC METABOLIC PANEL - Abnormal; Notable for the following:    Glucose, Bld 137 (*)    GFR calc non Af Amer 46 (*)    GFR calc Af Amer 53 (*)    All other components within normal limits   BASIC METABOLIC PANEL - Abnormal; Notable for the following:    Glucose, Bld 121 (*)    Creatinine, Ser 1.36 (*)    GFR calc non Af Amer 43 (*)    GFR calc Af Amer 50 (*)    All other components within normal limits  CBC - Abnormal; Notable for the following:    RBC 3.88 (*)    Hemoglobin 10.2 (*)    HCT 32.8 (*)    RDW 17.2 (*)    All other components within normal limits  URINE CULTURE  URINE MICROSCOPIC-ADD ON  PROTIME-INR    Imaging Review US Renal  05/20/2014   CLINICAL DATA:  Hematuria.  EXAM: RENAL/URINARY TRACT ULTRASOUND COMPLETE  COMPARISON:  Renal ultrasound 07/23/2012  FINDINGS: Right Kidney:  Length: 9.7 cm. No hydronephrosis. Mild increased renal parenchymal echogenicity.  Left Kidney:  Length: 9.7 cm. No hydronephrosis. Mild increased renal parenchymal echogenicity.  Bladder:  Decompressed with Foley catheter.  IMPRESSION: No hydronephrosis.  Mild increased renal cortical echogenicity as can be seen with chronic medical renal disease.   Electronically Signed   By: Lovey Newcomer M.D.   On: 05/20/2014 17:04    Urology was consulted.  Dr. Wynetta Emery saw the patient and irrigated his bladder.  Patient was turned over to Dr. Venora Maples for final disposition.  Patient seemed to be improved with less pain.  MDM   Final diagnoses:  Urinary retention  Hematuria        Dot Lanes, MD 05/21/14 [phone]

## 2014-05-20 NOTE — ED Notes (Signed)
Attempted to call report to floor, requests call back. Spoke with Dr. Wynetta Emery re: pt's pain after attempting CBI with no output after 50-37ml infusion. Able to manually irrigate with effort to get back slightly less than what was manually put in. Per Wynetta Emery, MD, most likely bladder spasms, orders received.

## 2014-05-20 NOTE — ED Notes (Signed)
Pt in from home. Has cyst on bladder. No surgery done due to age. Normally has blood in urine. Did pass a blood clot just PTA but still unable to urinate. Hx CHF, lower extremity edema, worse today according to family.

## 2014-05-20 NOTE — ED Notes (Signed)
Bed: PP89 Expected date: 05/20/14 Expected time: 12:48 PM Means of arrival: Ambulance Comments: Urinary retention

## 2014-05-20 NOTE — Progress Notes (Signed)
Pt arrived to unit on stretcher, slid to bed w/ 3 assist. Foley w/ CBI clamped, draining bright red urine. Pt denies pain/SP tenderness at present. Oriented to callbell and environment, POC discussed. Family bedside.

## 2014-05-20 NOTE — ED Notes (Signed)
Pt's foley clamped off for short period per [place] request for full bladder for proper scan.

## 2014-05-21 ENCOUNTER — Encounter (HOSPITAL_COMMUNITY)
Admission: EM | Disposition: A | Discharge status: Home / Self Care | Payer: Self-pay | Source: Home / Self Care | Attending: Urology

## 2014-05-21 ENCOUNTER — Observation Stay (HOSPITAL_COMMUNITY): Payer: Medicare Other | Admitting: Anesthesiology

## 2014-05-21 ENCOUNTER — Encounter (HOSPITAL_COMMUNITY): Payer: Self-pay | Admitting: *Deleted

## 2014-05-21 ENCOUNTER — Encounter (HOSPITAL_COMMUNITY): Payer: Medicare Other | Admitting: Anesthesiology

## 2014-05-21 DIAGNOSIS — C675 Malignant neoplasm of bladder neck: Secondary | ICD-10-CM | POA: Diagnosis present

## 2014-05-21 DIAGNOSIS — I10 Essential (primary) hypertension: Secondary | ICD-10-CM | POA: Diagnosis present

## 2014-05-21 DIAGNOSIS — I509 Heart failure, unspecified: Secondary | ICD-10-CM | POA: Diagnosis present

## 2014-05-21 DIAGNOSIS — I252 Old myocardial infarction: Secondary | ICD-10-CM | POA: Diagnosis not present

## 2014-05-21 DIAGNOSIS — Z79899 Other long term (current) drug therapy: Secondary | ICD-10-CM | POA: Diagnosis not present

## 2014-05-21 DIAGNOSIS — Z8673 Personal history of transient ischemic attack (TIA), and cerebral infarction without residual deficits: Secondary | ICD-10-CM | POA: Diagnosis not present

## 2014-05-21 DIAGNOSIS — R319 Hematuria, unspecified: Secondary | ICD-10-CM | POA: Diagnosis present

## 2014-05-21 DIAGNOSIS — Z8546 Personal history of malignant neoplasm of prostate: Secondary | ICD-10-CM | POA: Diagnosis not present

## 2014-05-21 DIAGNOSIS — E785 Hyperlipidemia, unspecified: Secondary | ICD-10-CM | POA: Diagnosis present

## 2014-05-21 HISTORY — PX: CYSTO: SHX6284

## 2014-05-21 HISTORY — PX: TRANSURETHRAL RESECTION OF BLADDER TUMOR WITH GYRUS (TURBT-GYRUS): SHX6458

## 2014-05-21 LAB — BASIC METABOLIC PANEL
Anion gap: 13 (ref 5–15)
BUN: 22 mg/dL (ref 6–23)
CALCIUM: 9.1 mg/dL (ref 8.4–10.5)
CO2: 26 mEq/L (ref 19–32)
Chloride: 103 mEq/L (ref 96–112)
Creatinine, Ser: 1.36 mg/dL — ABNORMAL HIGH (ref 0.50–1.35)
GFR, EST AFRICAN AMERICAN: 50 mL/min — AB (ref >90)
GFR, EST NON AFRICAN AMERICAN: 43 mL/min — AB (ref >90)
GLUCOSE: 121 mg/dL — AB (ref 70–99)
Potassium: 3.9 mEq/L (ref 3.7–5.3)
Sodium: 142 mEq/L (ref 137–147)

## 2014-05-21 LAB — CBC
HCT: 32.8 % — ABNORMAL LOW (ref 39.0–52.0)
Hemoglobin: 10.2 g/dL — ABNORMAL LOW (ref 13.0–17.0)
MCH: 26.3 pg (ref 26.0–34.0)
MCHC: 31.1 g/dL (ref 30.0–36.0)
MCV: 84.5 fL (ref 78.0–100.0)
PLATELETS: 224 10*3/uL (ref 150–400)
RBC: 3.88 MIL/uL — ABNORMAL LOW (ref 4.22–5.81)
RDW: 17.2 % — AB (ref 11.5–15.5)
WBC: 7.9 10*3/uL (ref 4.0–10.5)

## 2014-05-21 LAB — MRSA PCR SCREENING: MRSA BY PCR: NEGATIVE

## 2014-05-21 SURGERY — CYSTO
Anesthesia: General | Site: Bladder

## 2014-05-21 MED ORDER — SODIUM CHLORIDE 0.9 % IJ SOLN
INTRAMUSCULAR | Status: AC
  Filled 2014-05-21: qty 10

## 2014-05-21 MED ORDER — FENTANYL CITRATE 0.05 MG/ML IJ SOLN
INTRAMUSCULAR | Status: AC
  Filled 2014-05-21: qty 2

## 2014-05-21 MED ORDER — PROPOFOL 10 MG/ML IV BOLUS
INTRAVENOUS | Status: DC | PRN
  Administered 2014-05-21: 120 mg via INTRAVENOUS

## 2014-05-21 MED ORDER — SODIUM CHLORIDE 0.9 % IR SOLN
3000.0000 mL | Status: DC
  Administered 2014-05-21 (×2): 3000 mL

## 2014-05-21 MED ORDER — LIDOCAINE HCL 2 % EX GEL
CUTANEOUS | Status: AC
  Filled 2014-05-21: qty 10

## 2014-05-21 MED ORDER — LACTATED RINGERS IV SOLN
INTRAVENOUS | Status: DC | PRN
  Administered 2014-05-21: 13:00:00 via INTRAVENOUS

## 2014-05-21 MED ORDER — STERILE WATER FOR IRRIGATION IR SOLN
Status: DC | PRN
  Administered 2014-05-21: 1000 mL

## 2014-05-21 MED ORDER — BACITRACIN ZINC 500 UNIT/GM EX OINT
TOPICAL_OINTMENT | CUTANEOUS | Status: AC
  Filled 2014-05-21: qty 28.35

## 2014-05-21 MED ORDER — CHLORHEXIDINE GLUCONATE CLOTH 2 % EX PADS
6.0000 | MEDICATED_PAD | Freq: Every day | CUTANEOUS | Status: DC
  Administered 2014-05-21 – 2014-05-22 (×2): 6 via TOPICAL

## 2014-05-21 MED ORDER — PROPOFOL 10 MG/ML IV BOLUS
INTRAVENOUS | Status: AC
  Filled 2014-05-21: qty 20

## 2014-05-21 MED ORDER — ONDANSETRON HCL 4 MG/2ML IJ SOLN
INTRAMUSCULAR | Status: DC | PRN
  Administered 2014-05-21: 4 mg via INTRAVENOUS

## 2014-05-21 MED ORDER — CIPROFLOXACIN IN D5W 400 MG/200ML IV SOLN
400.0000 mg | Freq: Two times a day (BID) | INTRAVENOUS | Status: DC
  Administered 2014-05-21 – 2014-05-22 (×3): 400 mg via INTRAVENOUS
  Filled 2014-05-21 (×4): qty 200

## 2014-05-21 MED ORDER — EPHEDRINE SULFATE 50 MG/ML IJ SOLN
INTRAMUSCULAR | Status: AC
  Filled 2014-05-21: qty 1

## 2014-05-21 MED ORDER — FENTANYL CITRATE 0.05 MG/ML IJ SOLN: 25.0000 ug | INTRAMUSCULAR | Status: DC | PRN

## 2014-05-21 MED ORDER — BACITRACIN ZINC 500 UNIT/GM EX OINT
TOPICAL_OINTMENT | CUTANEOUS | Status: DC | PRN
  Administered 2014-05-21: 1 via TOPICAL

## 2014-05-21 MED ORDER — SODIUM CHLORIDE 0.9 % IR SOLN
Status: DC | PRN
  Administered 2014-05-21: 12000 mL via INTRAVESICAL

## 2014-05-21 MED ORDER — LIDOCAINE HCL 2 % IJ SOLN
INTRAMUSCULAR | Status: DC | PRN
  Administered 2014-05-21: 2 mg

## 2014-05-21 MED ORDER — BELLADONNA ALKALOIDS-OPIUM 16.2-60 MG RE SUPP
RECTAL | Status: AC
  Filled 2014-05-21: qty 1

## 2014-05-21 MED ORDER — FENTANYL CITRATE 0.05 MG/ML IJ SOLN
INTRAMUSCULAR | Status: DC | PRN
  Administered 2014-05-21 (×3): 12.5 ug via INTRAVENOUS
  Administered 2014-05-21: 50 ug via INTRAVENOUS
  Administered 2014-05-21: 12.5 ug via INTRAVENOUS

## 2014-05-21 MED ORDER — DEXAMETHASONE SODIUM PHOSPHATE 10 MG/ML IJ SOLN
INTRAMUSCULAR | Status: DC | PRN
  Administered 2014-05-21: 10 mg via INTRAVENOUS

## 2014-05-21 MED ORDER — EPHEDRINE SULFATE 50 MG/ML IJ SOLN
INTRAMUSCULAR | Status: DC | PRN
  Administered 2014-05-21: 10 mg via INTRAVENOUS
  Administered 2014-05-21: 5 mg via INTRAVENOUS

## 2014-05-21 MED ORDER — BELLADONNA ALKALOIDS-OPIUM 16.2-60 MG RE SUPP
RECTAL | Status: DC | PRN
  Administered 2014-05-21: 1 via RECTAL

## 2014-05-21 SURGICAL SUPPLY — 13 items
BAG URO CATCHER STRL LF (DRAPE) ×2 IMPLANT
CATH HEMA 3WAY 30CC 24FR COUDE (CATHETERS) ×2 IMPLANT
DRSG MEPILEX BORDER 4X4 (GAUZE/BANDAGES/DRESSINGS) ×2 IMPLANT
ELECT LOOP MED HF 24F 12D CBL (CLIP) ×2 IMPLANT
GLOVE BIOGEL M 7.0 STRL (GLOVE) ×4 IMPLANT
GLOVE SURG SS PI 6.5 STRL IVOR (GLOVE) ×4 IMPLANT
GOWN STRL REUS W/TWL XL LVL3 (GOWN DISPOSABLE) ×4 IMPLANT
KIT ASPIRATION TUBING (SET/KITS/TRAYS/PACK) ×2 IMPLANT
MANIFOLD NEPTUNE II (INSTRUMENTS) ×2 IMPLANT
PACK CYSTO (CUSTOM PROCEDURE TRAY) ×2 IMPLANT
SYRINGE 35CC LL (MISCELLANEOUS) ×2 IMPLANT
SYRINGE IRR TOOMEY STRL 70CC (SYRINGE) ×2 IMPLANT
TUBING IRRIGATION (MISCELLANEOUS) ×2 IMPLANT

## 2014-05-21 NOTE — Anesthesia Preprocedure Evaluation (Addendum)
Anesthesia Evaluation  Patient identified by MRN, date of birth, ID band Patient awake    Reviewed: Allergy & Precautions, H&P , NPO status , Patient's Chart, lab work & pertinent test results  Airway Mallampati: II TM Distance: >3 FB Neck ROM: Full    Dental no notable dental hx.    Pulmonary former smoker,  breath sounds clear to auscultation  Pulmonary exam normal       Cardiovascular hypertension, Pt. on medications + Past MI and +CHF Rhythm:Regular Rate:Normal  ECHO 2006 with good EF and mild AI  Non Q wave MI in 2012   Neuro/Psych CVA negative psych ROS   GI/Hepatic negative GI ROS, Neg liver ROS,   Endo/Other  negative endocrine ROS  Renal/GU Renal InsufficiencyRenal diseaseCr 1.36 K 3.9  negative genitourinary   Musculoskeletal negative musculoskeletal ROS (+)   Abdominal   Peds negative pediatric ROS (+)  Hematology negative hematology ROS (+) anemia , Hgb 10.2   Anesthesia Other Findings   Reproductive/Obstetrics negative OB ROS                         Anesthesia Physical Anesthesia Plan  ASA: III  Anesthesia Plan: General   Post-op Pain Management:    Induction: Intravenous  Airway Management Planned: LMA  Additional Equipment:   Intra-op Plan:   Post-operative Plan: Extubation in OR  Informed Consent: I have reviewed the patients History and Physical, chart, labs and discussed the procedure including the risks, benefits and alternatives for the proposed anesthesia with the patient or authorized representative who has indicated his/her understanding and acceptance.   Dental advisory given  Plan Discussed with: CRNA  Anesthesia Plan Comments:         Anesthesia Quick Evaluation

## 2014-05-21 NOTE — Transfer of Care (Signed)
Immediate Anesthesia Transfer of Care Note  Patient: Mark Yang  Procedure(s) Performed: Procedure(s): CYSTO (N/A) TRANSURETHRAL RESECTION OF BLADDER TUMOR WITH GYRUS WITH CLOT EVACUATION (N/A)  Patient Location: PACU  Anesthesia Type:General  Level of Consciousness: awake, alert  and patient cooperative  Airway & Oxygen Therapy: Patient Spontanous Breathing and Patient connected to face mask oxygen  Post-op Assessment: Report given to PACU RN and Post -op Vital signs reviewed and stable  Post vital signs: Reviewed and stable  Complications: No apparent anesthesia complications

## 2014-05-21 NOTE — Progress Notes (Signed)
UR completed 

## 2014-05-21 NOTE — Progress Notes (Signed)
  Subjective: NAEON. Could not tolerate CBI due to bladder spasms. Otherwise, no issues.   Objective: Vital signs in last 24 hours: Temp:  [97.4 F (36.3 C)-99 F (37.2 C)] 98.4 F (36.9 C) (09/20 0534) Pulse Rate:  [60-107] 60 (09/20 0534) Resp:  [16-20] 20 (09/20 0534) BP: (126-173)/(66-83) 126/66 mmHg (09/20 0534) SpO2:  [95 %-100 %] 98 % (09/20 0534) Weight:  [67.1 kg (147 lb 14.9 oz)] 67.1 kg (147 lb 14.9 oz) (09/19 1856)  Intake/Output from previous day: 09/19 0701 - 09/20 0700 In: 220 [P.O.:120; IV Piggyback:100] Out: 510 [Urine:510] Intake/Output this shift:    Physical Exam:  General: Alert and oriented CV: RRR Lungs: Clear Abdomen: Soft, ND. GU: Foley with dark red urine, no clots Ext: NT, No erythema  Lab Results:  Recent Labs  05/20/14 1603 05/21/14 0531  HGB 10.7* 10.2*  HCT 33.6* 32.8*   BMET  Recent Labs  05/20/14 1603 05/21/14 0531  NA 143 142  K 3.7 3.9  CL 103 103  CO2 25 26  GLUCOSE 137* 121*  BUN 23 22  CREATININE 1.30 1.36*  CALCIUM 9.0 9.1     Studies/Results: US Renal  05/20/2014   CLINICAL DATA:  Hematuria.  EXAM: RENAL/URINARY TRACT ULTRASOUND COMPLETE  COMPARISON:  Renal ultrasound 07/23/2012  FINDINGS: Right Kidney:  Length: 9.7 cm. No hydronephrosis. Mild increased renal parenchymal echogenicity.  Left Kidney:  Length: 9.7 cm. No hydronephrosis. Mild increased renal parenchymal echogenicity.  Bladder:  Decompressed with Foley catheter.  IMPRESSION: No hydronephrosis.  Mild increased renal cortical echogenicity as can be seen with chronic medical renal disease.   Electronically Signed   By: Lovey Newcomer M.D.   On: 05/20/2014 17:04    Assessment/Plan: 89M with history of PCa treated with brachy in 2007 with known bladder tumor and hematuria since at least 05/2013. Refused recommended palliative TURBT at that time. Now returns with continued gross hematuria and questionable clot retention. 24Fr 3-way hematuria catheter in place,  but cannot tolerate CBI.  Due to ongoing hematuria, discussed with family the rationale for palliative cysto, clot evac, possible TURBT/TURP to hopefully identify source of bleeding and control this. Risks and benefits discussed and consent was obtained by the daughter Alyce Pagan - [phone]).    LOS: 1 day   Aianna Fahs C 05/21/2014, 9:11 AM

## 2014-05-21 NOTE — Op Note (Signed)
Preoperative diagnosis:  1. Prostate cancer 2. Bladder cancer 3. Hematuria  Postoperative diagnosis: 1. Same  Procedure(s): 1. Cystoscopy, clot evacuation 2. Transurethral resection of bladder tumor > 5cm  Surgeon: Dr. Rana Snare  Anesthesia: General  Complications: None  EBL: Minimal  Specimens: Bladder tumor  Intraoperative findings: Extensive papillary and necrotic tumor circumferentially around the bladder neck, posterior bladder wall, and dome. Majority of tumor resected and cauterized. Bladder neck was clear at the end of the case. No large residual tumor was left.   Indication: 32M with history of prostate cancer treated with brachytherapy in 2007 and a papillary bladder tumor at the bladder neck noted in 2014. Patient and family electively deferred treatment at that time. Unfortunately, his urine has become increasingly bloody and went into clot retention on 9/19 which brought him to the OR. Unable to irrigate to clear urine due to low capacity bladder and likely contracted bladder due to radiation changes. After long discussion with family, elected to proceed with palliative cystoscopy, clot evacuation and possible TURBT/TURP for both diagnostic and therapeutic purposes.   Description of procedure:  Patient verified in holding. Informed consent had been signed. He was placed on the OR table and general anesthesia was induced. He was then placed in dorsal lithotomy and prepped and draped in the usual fashion. The cystoscope was placed into the bladder with the above noted findings. Clot was manually evacuated.   The resectoscope was then placed into the bladder and the extensive resection of bladder tumor was undertaken, primarily around the bladder neck and in multiple other areas on the bladder dome and posterior bladder wall. Total tumor volume was much greater than 5cm. Hemostasis was obtained. Chips were irrigated out. Inactive prostate seeds were visualized and irrigated  out.  A 24Fr 3 way hematuria foley catheter was replaced into the bladder and he was awoken from anesthesia having tolerated the procedure well.

## 2014-05-21 NOTE — Anesthesia Postprocedure Evaluation (Signed)
  Anesthesia Post-op Note  Patient: Mark Yang  Procedure(s) Performed: Procedure(s) (LRB): CYSTO (N/A) TRANSURETHRAL RESECTION OF BLADDER TUMOR WITH GYRUS WITH CLOT EVACUATION (N/A)  Patient Location: PACU  Anesthesia Type: General  Level of Consciousness: awake and alert   Airway and Oxygen Therapy: Patient Spontanous Breathing  Post-op Pain: mild  Post-op Assessment: Post-op Vital signs reviewed, Patient's Cardiovascular Status Stable, Respiratory Function Stable, Patent Airway and No signs of Nausea or vomiting  Last Vitals:  Filed Vitals:   05/21/14 1545  BP: 165/84  Pulse: 60  Temp: 36.7 C  Resp: 16    Post-op Vital Signs: stable   Complications: No apparent anesthesia complications

## 2014-05-22 ENCOUNTER — Encounter (HOSPITAL_COMMUNITY): Payer: Self-pay | Admitting: Urology

## 2014-05-22 DIAGNOSIS — C675 Malignant neoplasm of bladder neck: Secondary | ICD-10-CM

## 2014-05-22 LAB — URINE CULTURE
COLONY COUNT: NO GROWTH
Culture: NO GROWTH

## 2014-05-22 LAB — CBC
HCT: 30 % — ABNORMAL LOW (ref 39.0–52.0)
Hemoglobin: 9.5 g/dL — ABNORMAL LOW (ref 13.0–17.0)
MCH: 26.7 pg (ref 26.0–34.0)
MCHC: 31.7 g/dL (ref 30.0–36.0)
MCV: 84.3 fL (ref 78.0–100.0)
PLATELETS: 194 10*3/uL (ref 150–400)
RBC: 3.56 MIL/uL — ABNORMAL LOW (ref 4.22–5.81)
RDW: 17.2 % — AB (ref 11.5–15.5)
WBC: 7.4 10*3/uL (ref 4.0–10.5)

## 2014-05-22 LAB — BASIC METABOLIC PANEL
ANION GAP: 12 (ref 5–15)
BUN: 27 mg/dL — ABNORMAL HIGH (ref 6–23)
CALCIUM: 8.6 mg/dL (ref 8.4–10.5)
CO2: 24 mEq/L (ref 19–32)
CREATININE: 1.48 mg/dL — AB (ref 0.50–1.35)
Chloride: 104 mEq/L (ref 96–112)
GFR, EST AFRICAN AMERICAN: 45 mL/min — AB (ref >90)
GFR, EST NON AFRICAN AMERICAN: 39 mL/min — AB (ref >90)
Glucose, Bld: 134 mg/dL — ABNORMAL HIGH (ref 70–99)
Potassium: 4.3 mEq/L (ref 3.7–5.3)
Sodium: 140 mEq/L (ref 137–147)

## 2014-05-22 NOTE — Progress Notes (Signed)
CSW consulted for transportation needs. Patient will need non-emergency ambulance transport home. CSW confirmed home address with patient's daughters at bedside. [facility] called for transport. No other CSW needs identified - CSW signing off.   Raynaldo Opitz, [facility] Clinical Social Worker cell #: [phone]

## 2014-05-22 NOTE — Progress Notes (Signed)
Pt d/c in stable condition to home at this time by [facility]. D/C instructions reviewed w/ pt and both dtrs, all verbalized understanding. Foley care demonstrated and instructed to both dtrs and both verbalize comfort and competence w/ care. Pt d/c in possession of d/c instructions, urinal, and all personal belongings.

## 2014-05-22 NOTE — Discharge Summary (Signed)
Patient ID: JD MCCASTER MRN: [id number] DOB/AGE: March 20, 1918 80 y.o.  Admit date: 05/20/2014 Discharge date: 05/22/2014    Discharge Diagnoses:   Present on Admission:  . Hematuria  Consults:  None    Discharge Medications:   Medication List         cloNIDine 0.1 MG tablet  Commonly known as:  CATAPRES  Take 0.1 mg by mouth daily.     donepezil 5 MG tablet  Commonly known as:  ARICEPT  Take 5 mg by mouth at bedtime.     ferrous sulfate 325 (65 FE) MG tablet  Take 325 mg by mouth daily with breakfast.     furosemide 40 MG tablet  Commonly known as:  LASIX  Take 40 mg by mouth daily.     LORazepam 1 MG tablet  Commonly known as:  ATIVAN  Take 1 mg by mouth daily.     multivitamin with minerals Tabs tablet  Take 1 tablet by mouth daily.     NAMENDA XR 28 MG Cp24  Generic drug:  Memantine HCl ER  Take by mouth.     simvastatin 20 MG tablet  Commonly known as:  ZOCOR  Take 20 mg by mouth daily.     STOOL SOFTENER PO  Take 1 capsule by mouth daily as needed (constipation.).     VITAMIN C PO  Take 1 tablet by mouth daily.         Significant Diagnostic Studies:  US Renal  05/20/2014   CLINICAL DATA:  Hematuria.  EXAM: RENAL/URINARY TRACT ULTRASOUND COMPLETE  COMPARISON:  Renal ultrasound 07/23/2012  FINDINGS: Right Kidney:  Length: 9.7 cm. No hydronephrosis. Mild increased renal parenchymal echogenicity.  Left Kidney:  Length: 9.7 cm. No hydronephrosis. Mild increased renal parenchymal echogenicity.  Bladder:  Decompressed with Foley catheter.  IMPRESSION: No hydronephrosis.  Mild increased renal cortical echogenicity as can be seen with chronic medical renal disease.   Electronically Signed   By: Lovey Newcomer M.D.   On: 05/20/2014 17:04      Hospital Course:  Active Problems:   Hematuria   Malignant neoplasm of urinary bladder neck  Patient presented with months of gross hematuria. He has a prior history of prostate cancer status post brachii therapy.  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 The patient was noted to have severely grossly bloody urine and was admitted for observation and supportive care. Next morning the patient was taken to surgery where he underwent clot evacuation. He was noted to have extensive transitional cell carcinoma circumferentially around the bladder neck and involving the anterior wall the bladder. This was grossly completely resected. The patient remained stable and had no obvious complications. Urine was clear the next morning we felt the patient be discharged with an indwelling Foley catheter. Day of Discharge BP 115/61  Pulse 61  Temp(Src) 98 F (36.7 C) (Oral)  Resp 18  Ht 5\' 9"  (1.753 m)  Wt 67.1 kg (147 lb 14.9 oz)  BMI 21.84 kg/m2  SpO2 94%  Well-developed well-nourished elderly male. He continues to be disoriented and has severe dementia. Respiratory effort appears normal. Abdomen soft and nontender GU indwelling catheter draining clear yellow urine Extremities without edema  Results for orders placed during the hospital encounter of 05/20/14 (from the past 24 hour(s))  CBC     Status: Abnormal   Collection Time    05/22/14  4:20 AM  Result Value Ref Range   WBC 7.4  4.0 - 10.5 K/uL   RBC 3.56 (*) 4.22 - 5.81 MIL/uL   Hemoglobin 9.5 (*) 13.0 - 17.0 g/dL   HCT 30.0 (*) 39.0 - 52.0 %   MCV 84.3  78.0 - 100.0 fL   MCH 26.7  26.0 - 34.0 pg   MCHC 31.7  30.0 - 36.0 g/dL   RDW 17.2 (*) 11.5 - 15.5 %   Platelets 194  150 - 400 K/uL  BASIC METABOLIC PANEL     Status: Abnormal   Collection Time    05/22/14  4:20 AM      Result Value Ref Range   Sodium 140  137 - 147 mEq/L   Potassium 4.3  3.7 - 5.3 mEq/L   Chloride 104  96 - 112 mEq/L   CO2 24  19 - 32 mEq/L   Glucose, Bld 134 (*) 70 - 99 mg/dL   BUN 27 (*) 6 - 23 mg/dL   Creatinine, Ser 1.48 (*) 0.50 - 1.35 mg/dL   Calcium 8.6  8.4 - 10.5  mg/dL   GFR calc non Af Amer 39 (*) >90 mL/min   GFR calc Af Amer 45 (*) >90 mL/min   Anion gap 12  5 - 15

## 2014-05-22 NOTE — Discharge Instructions (Addendum)
Transurethral Resection of Bladder Tumor (TURBT)   Definition:  Transurethral Resection of the Bladder Tumor is a surgical procedure used to diagnose and remove tumors within the bladder. TURBT is the most common treatment for early stage bladder cancer.  General instructions:     Your recent bladder surgery requires very little post hospital care but some definite precautions.  Despite the fact that no skin incisions were used, the area around the bladder incisions are raw and covered with scabs to promote healing and prevent bleeding. Certain precautions are needed to insure that the scabs are not disturbed over the next 2-4 weeks while the healing proceeds.  Because the raw surface inside your bladder and the irritating effects of urine you may expect frequency of urination and/or urgency (a stronger desire to urinate) and perhaps even getting up at night more often. This will usually resolve or improve slowly over the healing period. You may see some blood in your urine over the first 6 weeks. Do not be alarmed, even if the urine was clear for a while. Get off your feet and drink lots of fluids until clearing occurs. If you start to pass clots or don't improve call us.  Diet:  You may return to your normal diet immediately. Because of the raw surface of your bladder, alcohol, spicy foods, foods high in acid and drinks with caffeine may cause irritation or frequency and should be used in moderation. To keep your urine flowing freely and avoid constipation, drink plenty of fluids during the day (8-10 glasses). Tip: Avoid cranberry juice because it is very acidic.  Activity:  Your physical activity doesn't need to be restricted. However, if you are very active, you may see some blood in the urine. We suggest that you reduce your activity under the circumstances until the bleeding has stopped.  Bowels:  It is important to keep your bowels regular during the postoperative period. Straining  with bowel movements can cause bleeding. A bowel movement every other day is reasonable. Use a mild laxative if needed, such as milk of magnesia 2-3 tablespoons, or 2 Dulcolax tablets. Call if you continue to have problems. If you had been taking narcotics for pain, before, during or after your surgery, you may be constipated. Take a laxative if necessary.    Medication:  You should resume your pre-surgery medications unless told not to. In addition you may be given an antibiotic to prevent or treat infection. Antibiotics are not always necessary. All medication should be taken as prescribed until the bottles are finished unless you are having an unusual reaction to one of the drugs.  We will call to set up a followup to remove the catheter in 5-7 days

## 2014-07-14 ENCOUNTER — Encounter (HOSPITAL_COMMUNITY): Payer: Self-pay

## 2014-07-14 ENCOUNTER — Emergency Department (HOSPITAL_COMMUNITY): Payer: Medicare Other

## 2014-07-14 ENCOUNTER — Inpatient Hospital Stay (HOSPITAL_COMMUNITY)
Admission: EM | Admit: 2014-07-14 | Discharge: 2014-07-16 | DRG: 689 | Disposition: A | Discharge status: Home / Self Care | Payer: Medicare Other | Attending: Internal Medicine | Admitting: Internal Medicine

## 2014-07-14 DIAGNOSIS — R531 Weakness: Secondary | ICD-10-CM

## 2014-07-14 DIAGNOSIS — Z8546 Personal history of malignant neoplasm of prostate: Secondary | ICD-10-CM

## 2014-07-14 DIAGNOSIS — R4182 Altered mental status, unspecified: Secondary | ICD-10-CM | POA: Diagnosis present

## 2014-07-14 DIAGNOSIS — I69351 Hemiplegia and hemiparesis following cerebral infarction affecting right dominant side: Secondary | ICD-10-CM | POA: Diagnosis not present

## 2014-07-14 DIAGNOSIS — I252 Old myocardial infarction: Secondary | ICD-10-CM

## 2014-07-14 DIAGNOSIS — F0390 Unspecified dementia without behavioral disturbance: Secondary | ICD-10-CM | POA: Diagnosis present

## 2014-07-14 DIAGNOSIS — E785 Hyperlipidemia, unspecified: Secondary | ICD-10-CM | POA: Diagnosis present

## 2014-07-14 DIAGNOSIS — C675 Malignant neoplasm of bladder neck: Secondary | ICD-10-CM | POA: Diagnosis present

## 2014-07-14 DIAGNOSIS — Z79899 Other long term (current) drug therapy: Secondary | ICD-10-CM

## 2014-07-14 DIAGNOSIS — G9341 Metabolic encephalopathy: Secondary | ICD-10-CM | POA: Diagnosis present

## 2014-07-14 DIAGNOSIS — Z87891 Personal history of nicotine dependence: Secondary | ICD-10-CM | POA: Diagnosis not present

## 2014-07-14 DIAGNOSIS — D630 Anemia in neoplastic disease: Secondary | ICD-10-CM | POA: Diagnosis present

## 2014-07-14 DIAGNOSIS — C679 Malignant neoplasm of bladder, unspecified: Secondary | ICD-10-CM | POA: Diagnosis present

## 2014-07-14 DIAGNOSIS — I5032 Chronic diastolic (congestive) heart failure: Secondary | ICD-10-CM | POA: Diagnosis present

## 2014-07-14 DIAGNOSIS — N390 Urinary tract infection, site not specified: Principal | ICD-10-CM

## 2014-07-14 DIAGNOSIS — I10 Essential (primary) hypertension: Secondary | ICD-10-CM | POA: Diagnosis present

## 2014-07-14 HISTORY — DX: Malignant (primary) neoplasm, unspecified: C80.1

## 2014-07-14 LAB — URINALYSIS, ROUTINE W REFLEX MICROSCOPIC
Bilirubin Urine: NEGATIVE
Glucose, UA: NEGATIVE mg/dL
Ketones, ur: NEGATIVE mg/dL
Nitrite: NEGATIVE
Protein, ur: 100 mg/dL — AB
Specific Gravity, Urine: 1.025 (ref 1.005–1.030)
Urobilinogen, UA: 1 mg/dL (ref 0.0–1.0)
pH: 6 (ref 5.0–8.0)

## 2014-07-14 LAB — URINE MICROSCOPIC-ADD ON

## 2014-07-14 LAB — CBC WITH DIFFERENTIAL/PLATELET
Basophils Absolute: 0 10*3/uL (ref 0.0–0.1)
Basophils Relative: 0 % (ref 0–1)
EOS PCT: 1 % (ref 0–5)
Eosinophils Absolute: 0.1 10*3/uL (ref 0.0–0.7)
HCT: 35 % — ABNORMAL LOW (ref 39.0–52.0)
Hemoglobin: 11.5 g/dL — ABNORMAL LOW (ref 13.0–17.0)
LYMPHS ABS: 0.8 10*3/uL (ref 0.7–4.0)
LYMPHS PCT: 13 % (ref 12–46)
MCH: 26.2 pg (ref 26.0–34.0)
MCHC: 32.9 g/dL (ref 30.0–36.0)
MCV: 79.7 fL (ref 78.0–100.0)
MONO ABS: 0.6 10*3/uL (ref 0.1–1.0)
MONOS PCT: 10 % (ref 3–12)
Neutro Abs: 4.7 10*3/uL (ref 1.7–7.7)
Neutrophils Relative %: 76 % (ref 43–77)
PLATELETS: 299 10*3/uL (ref 150–400)
RBC: 4.39 MIL/uL (ref 4.22–5.81)
RDW: 18.5 % — AB (ref 11.5–15.5)
WBC: 6.3 10*3/uL (ref 4.0–10.5)

## 2014-07-14 LAB — HEPATIC FUNCTION PANEL
ALT: 12 U/L (ref 0–53)
AST: 22 U/L (ref 0–37)
Albumin: 2.8 g/dL — ABNORMAL LOW (ref 3.5–5.2)
Alkaline Phosphatase: 64 U/L (ref 39–117)
Total Bilirubin: 0.4 mg/dL (ref 0.3–1.2)
Total Protein: 7.1 g/dL (ref 6.0–8.3)

## 2014-07-14 LAB — BASIC METABOLIC PANEL
Anion gap: 13 (ref 5–15)
BUN: 16 mg/dL (ref 6–23)
CO2: 25 meq/L (ref 19–32)
CREATININE: 1.24 mg/dL (ref 0.50–1.35)
Calcium: 9 mg/dL (ref 8.4–10.5)
Chloride: 106 mEq/L (ref 96–112)
GFR calc Af Amer: 56 mL/min — ABNORMAL LOW (ref >90)
GFR calc non Af Amer: 48 mL/min — ABNORMAL LOW (ref >90)
Glucose, Bld: 113 mg/dL — ABNORMAL HIGH (ref 70–99)
Potassium: 3.9 mEq/L (ref 3.7–5.3)
SODIUM: 144 meq/L (ref 137–147)

## 2014-07-14 MED ORDER — HYDROCODONE-ACETAMINOPHEN 5-325 MG PO TABS
1.0000 | ORAL_TABLET | ORAL | Status: DC | PRN
  Filled 2014-07-14: qty 2

## 2014-07-14 MED ORDER — SIMVASTATIN 20 MG PO TABS
20.0000 mg | ORAL_TABLET | Freq: Every day | ORAL | Status: DC
  Administered 2014-07-15 – 2014-07-16 (×2): 20 mg via ORAL
  Filled 2014-07-14 (×3): qty 1

## 2014-07-14 MED ORDER — CEFTRIAXONE SODIUM 1 G IJ SOLR
1.0000 g | Freq: Once | INTRAMUSCULAR | Status: DC
  Administered 2014-07-14: 1 g via INTRAVENOUS
  Filled 2014-07-14: qty 10

## 2014-07-14 MED ORDER — ENOXAPARIN SODIUM 40 MG/0.4ML ~~LOC~~ SOLN
40.0000 mg | SUBCUTANEOUS | Status: DC
  Administered 2014-07-14 – 2014-07-15 (×2): 40 mg via SUBCUTANEOUS
  Filled 2014-07-14 (×3): qty 0.4

## 2014-07-14 MED ORDER — MEMANTINE HCL 10 MG PO TABS
10.0000 mg | ORAL_TABLET | Freq: Two times a day (BID) | ORAL | Status: DC
  Administered 2014-07-14 – 2014-07-16 (×4): 10 mg via ORAL
  Filled 2014-07-14 (×5): qty 1

## 2014-07-14 MED ORDER — LORAZEPAM 1 MG PO TABS
1.0000 mg | ORAL_TABLET | Freq: Every day | ORAL | Status: DC
  Administered 2014-07-15 – 2014-07-16 (×2): 1 mg via ORAL
  Filled 2014-07-14 (×2): qty 1

## 2014-07-14 MED ORDER — DONEPEZIL HCL 5 MG PO TABS
5.0000 mg | ORAL_TABLET | Freq: Every day | ORAL | Status: DC
Start: 2014-07-15 — End: 2014-07-16
  Administered 2014-07-15 – 2014-07-16 (×2): 5 mg via ORAL
  Filled 2014-07-14 (×3): qty 1

## 2014-07-14 MED ORDER — SODIUM CHLORIDE 0.9 % IV SOLN
INTRAVENOUS | Status: DC
  Administered 2014-07-14: 18:00:00 via INTRAVENOUS

## 2014-07-14 MED ORDER — CLONIDINE HCL 0.1 MG PO TABS
0.1000 mg | ORAL_TABLET | Freq: Every day | ORAL | Status: DC
  Administered 2014-07-14 – 2014-07-16 (×3): 0.1 mg via ORAL
  Filled 2014-07-14 (×3): qty 1

## 2014-07-14 MED ORDER — VITAMINS A & D EX OINT
TOPICAL_OINTMENT | CUTANEOUS | Status: AC
  Administered 2014-07-14: 5
  Filled 2014-07-14: qty 5

## 2014-07-14 MED ORDER — ACETAMINOPHEN 500 MG PO TABS
1000.0000 mg | ORAL_TABLET | Freq: Two times a day (BID) | ORAL | Status: DC
  Administered 2014-07-14 – 2014-07-16 (×4): 1000 mg via ORAL
  Filled 2014-07-14 (×6): qty 2

## 2014-07-14 MED ORDER — DOCUSATE SODIUM 100 MG PO CAPS
100.0000 mg | ORAL_CAPSULE | Freq: Two times a day (BID) | ORAL | Status: DC
  Administered 2014-07-14 – 2014-07-16 (×4): 100 mg via ORAL
  Filled 2014-07-14 (×5): qty 1

## 2014-07-14 MED ORDER — FERROUS SULFATE 325 (65 FE) MG PO TABS
325.0000 mg | ORAL_TABLET | Freq: Every day | ORAL | Status: DC
  Administered 2014-07-14 – 2014-07-15 (×2): 325 mg via ORAL
  Filled 2014-07-14 (×3): qty 1

## 2014-07-14 MED ORDER — ONDANSETRON HCL 4 MG/2ML IJ SOLN: 4.0000 mg | Freq: Four times a day (QID) | INTRAMUSCULAR | Status: DC | PRN

## 2014-07-14 MED ORDER — SODIUM CHLORIDE 0.9 % IV BOLUS (SEPSIS)
500.0000 mL | Freq: Once | INTRAVENOUS | Status: AC
  Administered 2014-07-14: 500 mL via INTRAVENOUS

## 2014-07-14 MED ORDER — ONDANSETRON HCL 4 MG PO TABS: 4.0000 mg | ORAL_TABLET | Freq: Four times a day (QID) | ORAL | Status: DC | PRN

## 2014-07-14 MED ORDER — CEFTRIAXONE SODIUM IN DEXTROSE 20 MG/ML IV SOLN
1.0000 g | INTRAVENOUS | Status: DC
  Administered 2014-07-15: 1 g via INTRAVENOUS
  Filled 2014-07-14 (×2): qty 50

## 2014-07-14 NOTE — Progress Notes (Signed)
Utilization Review completed.  Bri Wakeman RN CM  

## 2014-07-14 NOTE — Progress Notes (Addendum)
Admission ordered placed, med floor admission. Full admission note to follow.   Leisa Lenz, MD [phone]

## 2014-07-14 NOTE — H&P (Addendum)
[facility] History and Physical  Mark Yang YIF:[id number] DOB: 1918/03/26 DOA: 07/14/2014  Referring physician: ER physician PCP: No primary care provider on file.   Chief Complaint: altered mental status   HPI:  80 year old male with history of congestive heart failure, dementia, STEMI, HTN, stroke (residual right sided weakness), bladder tumor resection in September, presented to [facility] ED for evaluation of progressive confusion noted per family members, associated with generalized weakness, poor oral intake. This was noted several days prior to this admission. Please note that pt is unable to provide history due to dementia and family at bedside assisted with information. Per family members, pt has not been himself since this September and has been minimally ambulatory.   In ED, pt is hemodynamically stable, VSS, UA worrisome for UTI. [facility] asked to admit for further evaluation.   Assessment & Plan    Principal Problem:   Altered mental state - likely due to UTI, dementia - no acute intracranial findings seen on CT head - continue treatment for UTI - continue aricept and memantine  Active Problems:   UTI (lower urinary tract infection) - continue Rocephin as started in ED - follow up on urine culture    Dementia - will need PT/OT evaluation once more medically stable    Malignant neoplasm of urinary bladder neck - status post recent resection in September    HTN (hypertension) - continue Clonidine 0.1 mg daily   Diastolic CHF, chronic - last 2 D ECHO in 2006 with grade I diastolic dysfunction, no recent 2 D ECHO in EPIC - hold Lasix that pt takes at home and provide gentle hydration - re assess clinical status in AM and resume lasix once clinically indicated    Dyslipidemia - continue statin therapy   DVT prophylaxis: Lovenox SQ  Radiological Exams on Admission: Ct Head Wo Contrast  07/14/2014   CLINICAL DATA:  Altered mental status, agitation.  Prior stroke.   EXAM: CT HEAD WITHOUT CONTRAST  TECHNIQUE: Contiguous axial images were obtained from the base of the skull through the vertex without intravenous contrast.  COMPARISON:  Brain MRI 09/05/2005, head CT 06/11/2005  FINDINGS: Left occipital encephalomalacia and parietal encephalomalacia reidentified. Stable bilateral external capsule lacunar infarcts. No acute hemorrhage, infarct, or mass lesion is identified. No skull fracture. Left parietal skull probable venous lake reidentified. Soft tissue density material within the external auditory canals is most compatible with cerumen  IMPRESSION: No acute intracranial finding. No change in chronic findings as above.   Electronically Signed   By: Conchita Paris M.D.   On: 07/14/2014 15:22   Dg Chest Port 1 View  07/14/2014   CLINICAL DATA:  Patient has generalized weakness in agitation. History of dementia.  EXAM: PORTABLE CHEST - 1 VIEW  COMPARISON:  09/05/2010 bed 05/26/2006  FINDINGS: Cardiac leads project over chest. Lung volumes are slightly low. There is chronic mild prominence of the bronchial wall markings at the lung bases. There is slight volume loss at the left lung base. No definite airspace disease identified. No visible pleural effusion. Negative for pneumothorax. No acute osseous abnormality identified. Degenerative changes of both acromioclavicular joints.  IMPRESSION: Low lung volumes with mild left basilar atelectasis. Chronic mild prominence of the bronchial wall markings at the lung bases.   Electronically Signed   By: Curlene Dolphin M.D.   On: 07/14/2014 15:10    EKG: not done  Code Status: Full Family Communication: Family not at the bedside  Disposition Plan: Admit for further evaluation  Leisa Lenz, MD  [facility] Pager [phone]  Review of Systems:  Unable to obtain due to dementia     Past Medical History  Diagnosis Date  . CHF (congestive heart failure)   . NSTEMI (non-ST elevated myocardial infarction)   .  Hypertension   . Stroke   . Dyslipidemia   . Hyperhomocysteinemia   . Cancer     hx prostate cancer   Past Surgical History  Procedure Laterality Date  . Cysto N/A 05/21/2014    Procedure: CYSTO;  Surgeon: Bernestine Amass, MD;  Location: [facility];  Service: Urology;  Laterality: N/A;  . Transurethral resection of bladder tumor with gyrus (turbt-gyrus) N/A 05/21/2014    Procedure: TRANSURETHRAL RESECTION OF BLADDER TUMOR WITH GYRUS WITH CLOT EVACUATION;  Surgeon: Bernestine Amass, MD;  Location: [facility];  Service: Urology;  Laterality: N/A;  . Prostate seed implants     Social History:  reports that he quit smoking about 21 years ago. His smoking use included Cigarettes. He smoked 0.00 packs per day for 20 years. He has never used smokeless tobacco. He reports that he does not drink alcohol or use illicit drugs.  No Known Allergies  Family History: History reviewed. No pertinent family history.  Prior to Admission medications   Medication Sig Start Date End Date Taking? Authorizing Provider  acetaminophen (TYLENOL) 500 MG tablet Take 1,000 mg by mouth 2 (two) times daily.   Yes Historical Provider, MD  Ascorbic Acid (VITAMIN C PO) Take 1 tablet by mouth daily.   Yes Historical Provider, MD  cloNIDine (CATAPRES) 0.1 MG tablet Take 0.1 mg by mouth daily.   Yes Historical Provider, MD  Docusate Calcium (STOOL SOFTENER PO) Take 1 capsule by mouth every other day.    Yes Historical Provider, MD  donepezil (ARICEPT) 5 MG tablet Take 5 mg by mouth daily with breakfast.    Yes Historical Provider, MD  ferrous sulfate 325 (65 FE) MG tablet Take 325 mg by mouth at bedtime.    Yes Historical Provider, MD  furosemide (LASIX) 40 MG tablet Take 40 mg by mouth daily with breakfast.    Yes Historical Provider, MD  LORazepam (ATIVAN) 1 MG tablet Take 1 mg by mouth daily with breakfast.    Yes Historical Provider, MD  memantine (NAMENDA) 10 MG tablet Take 10 mg by mouth 2 (two) times daily.   Yes Historical  Provider, MD  Multiple Vitamin (MULTIVITAMIN WITH MINERALS) TABS tablet Take 1 tablet by mouth daily with breakfast.    Yes Historical Provider, MD  simvastatin (ZOCOR) 20 MG tablet Take 20 mg by mouth daily with breakfast.    Yes Historical Provider, MD   Physical Exam: Filed Vitals:   07/14/14 1311 07/14/14 1524  BP: 119/68 133/76  Pulse: 77 79  Temp: 97.5 F (36.4 C)   TempSrc: Oral   Resp: 20 20  SpO2: 99% 96%    Physical Exam  Constitutional: Appears well-developed and well-nourished. No distress.  HENT: Normocephalic. No tonsillar erythema or exudates Eyes: Conjunctivae and EOM are normal. PERRLA, no scleral icterus.  Neck: Normal ROM. Neck supple. No JVD.  No thyromegaly.  CVS: RRR, S1/S2 appreciated; SEM +2/6 appreciated  Pulmonary: Effort and breath sounds normal, no stridor, rhonchi, wheezes, rales.  Abdominal: Soft. BS +,  no distension, tenderness, rebound or guarding.  Musculoskeletal: Normal range of motion. Lymphadenopathy: No lymphadenopathy noted, cervical, inguinal. Neuro: Alert. No focal neurologic deficits. Skin: Skin is warm and dry.  Psychiatric: Normal mood  and affect. Behavior, judgment, thought content normal.   Labs on Admission:  Basic Metabolic Panel:  Recent Labs Lab 07/14/14 1354  NA 144  K 3.9  CL 106  CO2 25  GLUCOSE 113*  BUN 16  CREATININE 1.24  CALCIUM 9.0   Liver Function Tests:  Recent Labs Lab 07/14/14 1353  AST 22  ALT 12  ALKPHOS 64  BILITOT 0.4  PROT 7.1  ALBUMIN 2.8*   No results for input(s): LIPASE, AMYLASE in the last 168 hours. No results for input(s): AMMONIA in the last 168 hours. CBC:  Recent Labs Lab 07/14/14 1354  WBC 6.3  NEUTROABS 4.7  HGB 11.5*  HCT 35.0*  MCV 79.7  PLT 299   Cardiac Enzymes: No results for input(s): CKTOTAL, CKMB, CKMBINDEX, TROPONINI in the last 168 hours. BNP: Invalid input(s): POCBNP CBG: No results for input(s): GLUCAP in the last 168 hours.  If 7PM-7AM, please  contact night-coverage [email or web address] Password [facility] 07/14/2014, 3:53 PM

## 2014-07-14 NOTE — Progress Notes (Signed)
Utilization Review completed.  Laketa Sandoz RN CM  

## 2014-07-14 NOTE — ED Provider Notes (Signed)
CSN: [id number]     Arrival date & time 07/14/14  1308 History   First MD Initiated Contact with Patient 07/14/14 1409     Chief Complaint  Patient presents with  . Altered Mental Status     (Consider location/radiation/quality/duration/timing/severity/associated sxs/prior Treatment) HPI Comments: 80 year old male with history of congestive heart failure, and STEMI, high blood pressure, stroke, lipids, bladder tumor resection in September presents with confusion general weakness and right-sided weakness.  Family says he has a history of dementia in his symptoms of gradually worsened for the past week however he has not been his normal self since his procedure in September. Patient uses a walker at home, mild worsening agitation intermittently at home. No head injuries, fevers or focal pain. Patient denies symptoms however history of dementia. Patient has mild right-sided residual deficits from stroke however daughter feels this is more significant. Patient normally can get her on the walker and has had difficulty recently due to general weakness.patient lives with daughter and has home health for a few hours a day.  Patient is a 80 y.o. male presenting with altered mental status. The history is provided by the patient.  Altered Mental Status   Past Medical History  Diagnosis Date  . CHF (congestive heart failure)   . NSTEMI (non-ST elevated myocardial infarction)   . Hypertension   . Stroke   . Dyslipidemia   . Hyperhomocysteinemia    Past Surgical History  Procedure Laterality Date  . Cysto N/A 05/21/2014    Procedure: CYSTO;  Surgeon: Bernestine Amass, MD;  Location: [facility];  Service: Urology;  Laterality: N/A;  . Transurethral resection of bladder tumor with gyrus (turbt-gyrus) N/A 05/21/2014    Procedure: TRANSURETHRAL RESECTION OF BLADDER TUMOR WITH GYRUS WITH CLOT EVACUATION;  Surgeon: Bernestine Amass, MD;  Location: [facility];  Service: Urology;  Laterality: N/A;   History reviewed.  No pertinent family history. History  Substance Use Topics  . Smoking status: Former Smoker -- 20 years  . Smokeless tobacco: Not on file  . Alcohol Use: No    Review of Systems  Unable to perform ROS: Dementia      Allergies  Review of patient's allergies indicates no known allergies.  Home Medications   Prior to Admission medications   Medication Sig Start Date End Date Taking? Authorizing Provider  acetaminophen (TYLENOL) 500 MG tablet Take 1,000 mg by mouth 2 (two) times daily.   Yes Historical Provider, MD  Ascorbic Acid (VITAMIN C PO) Take 1 tablet by mouth daily.   Yes Historical Provider, MD  cloNIDine (CATAPRES) 0.1 MG tablet Take 0.1 mg by mouth daily.   Yes Historical Provider, MD  Docusate Calcium (STOOL SOFTENER PO) Take 1 capsule by mouth every other day.    Yes Historical Provider, MD  donepezil (ARICEPT) 5 MG tablet Take 5 mg by mouth daily with breakfast.    Yes Historical Provider, MD  ferrous sulfate 325 (65 FE) MG tablet Take 325 mg by mouth at bedtime.    Yes Historical Provider, MD  furosemide (LASIX) 40 MG tablet Take 40 mg by mouth daily with breakfast.    Yes Historical Provider, MD  LORazepam (ATIVAN) 1 MG tablet Take 1 mg by mouth daily with breakfast.    Yes Historical Provider, MD  memantine (NAMENDA) 10 MG tablet Take 10 mg by mouth 2 (two) times daily.   Yes Historical Provider, MD  Multiple Vitamin (MULTIVITAMIN WITH MINERALS) TABS tablet Take 1 tablet by mouth daily with  breakfast.    Yes Historical Provider, MD  simvastatin (ZOCOR) 20 MG tablet Take 20 mg by mouth daily with breakfast.    Yes Historical Provider, MD   BP 133/76 mmHg  Pulse 79  Temp(Src) 97.5 F (36.4 C) (Oral)  Resp 20  SpO2 96% Physical Exam  Constitutional: He appears well-developed and well-nourished. No distress.  HENT:  Head: Normocephalic and atraumatic. Raccoon's eyes: dry mucous membranes.  Eyes: Right eye exhibits no discharge. Left eye exhibits no discharge.   Neck: Normal range of motion. Neck supple. No tracheal deviation present.  Cardiovascular: Normal rate and regular rhythm.   Pulmonary/Chest: Effort normal. He has rales (mild at bases bilateral).  Abdominal: Soft. He exhibits no distension. There is no tenderness. There is no guarding.  Musculoskeletal: He exhibits no tenderness.  Neurological: He is alert. GCS eye subscore is 4. GCS verbal subscore is 4. GCS motor subscore is 6.  Gen. Confusion, patient follows most commands. Patient has mild right-sided weakness worse in the right leg, gross sensation intact bilateral, neck supple, actual muscle function intact  Skin: Skin is warm. No rash noted.  Psychiatric:  Column, cooperative, mild dementia  Nursing note and vitals reviewed.   ED Course  Procedures (including critical care time) Labs Review Labs Reviewed  CBC WITH DIFFERENTIAL - Abnormal; Notable for the following:    Hemoglobin 11.5 (*)    HCT 35.0 (*)    RDW 18.5 (*)    All other components within normal limits  BASIC METABOLIC PANEL - Abnormal; Notable for the following:    Glucose, Bld 113 (*)    GFR calc non Af Amer 48 (*)    GFR calc Af Amer 56 (*)    All other components within normal limits  URINALYSIS, ROUTINE W REFLEX MICROSCOPIC - Abnormal; Notable for the following:    APPearance CLOUDY (*)    Hgb urine dipstick MODERATE (*)    Protein, ur 100 (*)    Leukocytes, UA MODERATE (*)    All other components within normal limits  URINE MICROSCOPIC-ADD ON - Abnormal; Notable for the following:    Bacteria, UA FEW (*)    All other components within normal limits  HEPATIC FUNCTION PANEL    Imaging Review Ct Head Wo Contrast  07/14/2014   CLINICAL DATA:  Altered mental status, agitation.  Prior stroke.  EXAM: CT HEAD WITHOUT CONTRAST  TECHNIQUE: Contiguous axial images were obtained from the base of the skull through the vertex without intravenous contrast.  COMPARISON:  Brain MRI 09/05/2005, head CT 06/11/2005   FINDINGS: Left occipital encephalomalacia and parietal encephalomalacia reidentified. Stable bilateral external capsule lacunar infarcts. No acute hemorrhage, infarct, or mass lesion is identified. No skull fracture. Left parietal skull probable venous lake reidentified. Soft tissue density material within the external auditory canals is most compatible with cerumen  IMPRESSION: No acute intracranial finding. No change in chronic findings as above.   Electronically Signed   By: Conchita Paris M.D.   On: 07/14/2014 15:22   Dg Chest Port 1 View  07/14/2014   CLINICAL DATA:  Patient has generalized weakness in agitation. History of dementia.  EXAM: PORTABLE CHEST - 1 VIEW  COMPARISON:  09/05/2010 bed 05/26/2006  FINDINGS: Cardiac leads project over chest. Lung volumes are slightly low. There is chronic mild prominence of the bronchial wall markings at the lung bases. There is slight volume loss at the left lung base. No definite airspace disease identified. No visible pleural effusion. Negative for pneumothorax.  No acute osseous abnormality identified. Degenerative changes of both acromioclavicular joints.  IMPRESSION: Low lung volumes with mild left basilar atelectasis. Chronic mild prominence of the bronchial wall markings at the lung bases.   Electronically Signed   By: Curlene Dolphin M.D.   On: 07/14/2014 15:10     EKG Interpretation   Date/Time:  Friday July 14 2014 14:58:47 EST Ventricular Rate:  79 PR Interval:  186 QRS Duration: 93 QT Interval:  393 QTC Calculation: 450 R Axis:   46 Text Interpretation:  Sinus rhythm Multiform ventricular premature  complexes Low voltage, extremity leads Anteroseptal infarct, old Similar  previous Confirmed by Kearsten Ginther  MD, Octaviano Mukai ([id number]) on 07/14/2014 3:00:51 PM      MDM   Final diagnoses:  Right sided weakness  Weakness generalized  UTI (lower urinary tract infection)   Patient presents with worsening general weakness and mild confusion symptoms  the past few weeks. Patient having difficulty sitting up for respiratory exam and patient normally consider up without issue per family. Plan for CT head look for obvious large stroke or bleeding, urinalysis look for infection, metabolic panel, small fluid bolus due to decreased by mouth intake recently. Chest x-ray look for pneumonia.  Chest x-ray no infiltrate, CT head no acute findings.urinalysis infected, Rocephin and culture ordered. With general weakness, decreased with ambulation plan for admission for IV antibiotics and physical therapy.  The patients results and plan were reviewed and discussed.   Any x-rays performed were personally reviewed by myself.   Differential diagnosis were considered with the presenting HPI.  Medications  sodium chloride 0.9 % bolus 500 mL (not administered)  cefTRIAXone (ROCEPHIN) 1 g in dextrose 5 % 50 mL IVPB (not administered)    Filed Vitals:   07/14/14 1311 07/14/14 1524  BP: 119/68 133/76  Pulse: 77 79  Temp: 97.5 F (36.4 C)   TempSrc: Oral   Resp: 20 20  SpO2: 99% 96%    Final diagnoses:  Right sided weakness  Weakness generalized  UTI (lower urinary tract infection)    Admission/ observation were discussed with the admitting physician, patient and/or family and they are comfortable with the plan.    Mariea Clonts, MD 07/14/14 [phone]

## 2014-07-14 NOTE — ED Notes (Signed)
Bed: WA06 Expected date:  Expected time:  Means of arrival:  Comments: Ems-ams

## 2014-07-14 NOTE — ED Notes (Signed)
Per EMS, pt from home.  Pt has hx of dementia.  Lives with wife and dtr.  Dtr states pt has not been "acting right" since 10 am.  Pt has hx of stroke with rt side residual.  Dtr states he is more agitated and making faces.  Pt can stand and use walker to walk per his baseline.  Cg 139.  bp 156/93, hr 80, 96% ra.

## 2014-07-15 DIAGNOSIS — I5032 Chronic diastolic (congestive) heart failure: Secondary | ICD-10-CM

## 2014-07-15 DIAGNOSIS — F0390 Unspecified dementia without behavioral disturbance: Secondary | ICD-10-CM

## 2014-07-15 DIAGNOSIS — C675 Malignant neoplasm of bladder neck: Secondary | ICD-10-CM

## 2014-07-15 LAB — CBC
HEMATOCRIT: 31 % — AB (ref 39.0–52.0)
HEMOGLOBIN: 10.1 g/dL — AB (ref 13.0–17.0)
MCH: 25.9 pg — AB (ref 26.0–34.0)
MCHC: 32.6 g/dL (ref 30.0–36.0)
MCV: 79.5 fL (ref 78.0–100.0)
PLATELETS: 266 10*3/uL (ref 150–400)
RBC: 3.9 MIL/uL — ABNORMAL LOW (ref 4.22–5.81)
RDW: 18.7 % — ABNORMAL HIGH (ref 11.5–15.5)
WBC: 5 10*3/uL (ref 4.0–10.5)

## 2014-07-15 LAB — BASIC METABOLIC PANEL
Anion gap: 12 (ref 5–15)
BUN: 15 mg/dL (ref 6–23)
CHLORIDE: 104 meq/L (ref 96–112)
CO2: 23 meq/L (ref 19–32)
Calcium: 8.3 mg/dL — ABNORMAL LOW (ref 8.4–10.5)
Creatinine, Ser: 1.25 mg/dL (ref 0.50–1.35)
GFR calc Af Amer: 55 mL/min — ABNORMAL LOW (ref >90)
GFR calc non Af Amer: 47 mL/min — ABNORMAL LOW (ref >90)
Glucose, Bld: 101 mg/dL — ABNORMAL HIGH (ref 70–99)
POTASSIUM: 3.7 meq/L (ref 3.7–5.3)
SODIUM: 139 meq/L (ref 137–147)

## 2014-07-15 MED ORDER — SODIUM CHLORIDE 0.45 % IV SOLN
INTRAVENOUS | Status: AC
  Administered 2014-07-15: 15:00:00 via INTRAVENOUS

## 2014-07-15 NOTE — Progress Notes (Signed)
Patient ID: Mark Yang, male   DOB: 03-01-1918, 80 y.o.   MRN: [id number] [facility] PROGRESS NOTE  Mark Yang SKA:[id number] DOB: April 09, 1918 DOA: 07/14/2014 PCP: [name], MD  Brief narrative: 80 year old male with history of congestive heart failure, dementia, STEMI, HTN, stroke (residual right sided weakness), bladder tumor resection in September, presented to [facility] ED for evaluation of progressive confusion noted per family members, associated with generalized weakness, poor oral intake. On admission, patient was found to have UTI and was started on empiric rocephin.  Assessment/Plan:    Principal Problem: Acute metabolic encephalopathy  - likely due to UTI, dementia - no acute intracranial findings seen on CT head - continue treatment for UTI - continue aricept and memantine  Active Problems: UTI (lower urinary tract infection) - continue Rocephin and follow up urine culture results  Anemia of chronic disease - likely secondary to history of malignancy - continue to monitor CBC - hemoglobin is 10.1 - no current indications for transfusion  Malignant neoplasm of urinary bladder neck - status post recent resection in September  Essential HTN (hypertension) - continue Clonidine 0.1 mg daily Diastolic CHF, chronic - last 2 D ECHO in 2006 with grade I diastolic dysfunction, no recent 2 D ECHO in [facility]IC - weight on admission 145 lbs - held lasix while we providing gentle hydration   Dyslipidemia - continue statin therapy   DVT prophylaxis  Lovenox SQ   Code Status: Full Family Communication: Family not at the bedside  Disposition Plan: Admit for further evaluation   IV access:   PeripheralIV  Procedures and diagnostic studies:    Ct Head Wo Contrast 07/14/2014   No acute intracranial finding. No change in chronic findings as above.    Dg Chest Port 1 View 07/14/2014    Low lung volumes with mild left basilar atelectasis. Chronic mild prominence of  the bronchial wall markings at the lung bases.   Medical Consultants:   None   Other Consultants:   None   IAnti-Infectives:    Rocephin 07/14/2014 -->   Leisa Lenz, MD  [facility] Pager [phone]  If 7PM-7AM, please contact night-coverage [email or web address] Password [facility] 07/15/2014, 8:48 AM   LOS: 1 day    HPI/Subjective: No acute overnight events.  Objective: Filed Vitals:   07/14/14 1524 07/14/14 1825 07/14/14 2055 07/15/14 0523  BP: 133/76 173/84 101/57 114/71  Pulse: 79 85 70 77  Temp:  97.8 F (36.6 C) 97.9 F (36.6 C) 98 F (36.7 C)  TempSrc:  Oral Oral Oral  Resp: 20 20 18 20   Height:  5\' 4"  (1.626 m)    Weight:  65.772 kg (145 lb)  65.681 kg (144 lb 12.8 oz)  SpO2: 96% 94% 98% 96%    Intake/Output Summary (Last 24 hours) at 07/15/14 0848 Last data filed at 07/15/14 4163  Gross per 24 hour  Intake 1137.5 ml  Output      0 ml  Net 1137.5 ml    Exam:   General:  Pt is alert, follows commands appropriately, not in acute distress  Cardiovascular: Regular rate and rhythm, S1/S2 appreciated; SEM appreciated   Respiratory: Clear to auscultation bilaterally, no wheezing, no crackles, no rhonchi  Abdomen: Soft, non tender, non distended, bowel sounds present  Extremities: No edema, pulses DP and PT palpable bilaterally  Neuro: Grossly nonfocal  Data Reviewed: Basic Metabolic Panel:  Recent Labs Lab 07/14/14 1354 07/15/14 0410  NA 144 139  K 3.9 3.7  CL 106 104  CO2 25 23  GLUCOSE 113* 101*  BUN 16 15  CREATININE 1.24 1.25  CALCIUM 9.0 8.3*   Liver Function Tests:  Recent Labs Lab 07/14/14 1353  AST 22  ALT 12  ALKPHOS 64  BILITOT 0.4  PROT 7.1  ALBUMIN 2.8*   No results for input(s): LIPASE, AMYLASE in the last 168 hours. No results for input(s): AMMONIA in the last 168 hours. CBC:  Recent Labs Lab 07/14/14 1354 07/15/14 0410  WBC 6.3 5.0  NEUTROABS 4.7  --   HGB 11.5* 10.1*  HCT 35.0* 31.0*  MCV 79.7 79.5   PLT 299 266   Cardiac Enzymes: No results for input(s): CKTOTAL, CKMB, CKMBINDEX, TROPONINI in the last 168 hours. BNP: Invalid input(s): POCBNP CBG: No results for input(s): GLUCAP in the last 168 hours.  No results found for this or any previous visit (from the past 240 hour(s)).   Scheduled Meds: . acetaminophen  1,000 mg Oral BID  . cefTRIAXone (ROCEPHIN)  IV  1 g Intravenous Q24H  . cloNIDine  0.1 mg Oral Daily  . docusate sodium  100 mg Oral BID  . donepezil  5 mg Oral Q breakfast  . enoxaparin (LOVENOX) injection  40 mg Subcutaneous Q24H  . ferrous sulfate  325 mg Oral QHS  . LORazepam  1 mg Oral Q breakfast  . memantine  10 mg Oral BID  . simvastatin  20 mg Oral Q breakfast

## 2014-07-15 NOTE — Plan of Care (Signed)
Problem: Phase I Progression Outcomes Goal: Pain controlled with appropriate interventions Outcome: Completed/Met Date Met:  07/15/14 Goal: Vital Signs stable- temperature less than 102 Outcome: Progressing Goal: Initial discharge plan identified Outcome: Progressing

## 2014-07-15 NOTE — Progress Notes (Signed)
Pt noted to have an redden area at his umbilicus. Area cleansed with normal saline and 2x2 in fold. Will cont to monitor.

## 2014-07-16 DIAGNOSIS — I10 Essential (primary) hypertension: Secondary | ICD-10-CM

## 2014-07-16 MED ORDER — FUROSEMIDE 20 MG PO TABS: 20.0000 mg | ORAL_TABLET | Freq: Every day | ORAL | Status: AC

## 2014-07-16 MED ORDER — NITROFURANTOIN MONOHYD MACRO 100 MG PO CAPS: 100.0000 mg | ORAL_CAPSULE | Freq: Two times a day (BID) | ORAL | Status: DC

## 2014-07-16 MED ORDER — WHEELCHAIR MISC: 1.0000 | Freq: Once | Status: AC

## 2014-07-16 NOTE — Progress Notes (Signed)
CSW consulted for transportation needs. Patient will need non-emergency ambulance transport home. CSW confirmed home address with patient's daughter, Belenda Cruise. [facility] called for transport. No other CSW needs identified - CSW signing off.   Raynaldo Opitz, [facility] Clinical Social Worker cell #: [phone]

## 2014-07-16 NOTE — Discharge Summary (Signed)
Physician Discharge Summary  Mark Yang HKV:[id number] DOB: [birth year] DOA: 07/14/2014  PCP: [name], MD  Admit date: 07/14/2014 Discharge date: 07/16/2014  Recommendations for Outpatient Follow-up:  1. Continue macrobid for 5 more days on discharge for urinary tract infection. 2. May continue blood pressure meds but make sure blood pressure 120/80 or above. So far blood pressure is 128/76. 3. Kidney function is within normal limits.  Discharge Diagnoses:  Principal Problem:   Altered mental state Active Problems:   UTI (lower urinary tract infection)   Dementia   Malignant neoplasm of urinary bladder neck   HTN (hypertension)   Diastolic CHF, chronic    Discharge Condition: stable   Diet recommendation: as tolerated   History of present illness:  80 year old male with history of congestive heart failure, dementia, STEMI, HTN, stroke (residual right sided weakness), bladder tumor resection in September, presented to [facility] ED for evaluation of progressive confusion noted per family members, associated with generalized weakness, poor oral intake. On admission, patient was found to have UTI and was started on empiric rocephin.  Assessment/Plan:    Principal Problem: Acute metabolic encephalopathy  - likely due to UTI, dementia - no acute intracranial findings seen on CT head - continue treatment for UTI - continue aricept and memantine  Active Problems: UTI (lower urinary tract infection) - continue Rocephin in hospital; changed to [facility] on discharge for 5 days - urine cultures not obtained on admission  Anemia of chronic disease - likely secondary to history of malignancy - hemoglobin is stable - no evidence of bleeding  Malignant neoplasm of urinary bladder neck - status post recent resection in September  Essential HTN (hypertension) - continue BP meds per home regimen  Diastolic CHF, chronic - last 2 D ECHO in 2006 with grade I diastolic  dysfunction, no recent 2 D ECHO in [facility] - weight on admission 145 lbs - may resume lasix on discharge at the lower dose  Dyslipidemia - continue statin therapy   DVT prophylaxis  Lovenox SQ   Code Status: Full Family Communication: Family not at the bedside  Disposition Plan: Admit for further evaluation   IV access:   PeripheralIV  Procedures and diagnostic studies:   Ct Head Wo Contrast 07/14/2014 No acute intracranial finding. No change in chronic findings as above.   Dg Chest Port 1 View 07/14/2014 Low lung volumes with mild left basilar atelectasis. Chronic mild prominence of the bronchial wall markings at the lung bases.   Medical Consultants:   None  Other Consultants:   None  IAnti-Infectives:    Rocephin 07/14/2014 -->   Signed:  Leisa Lenz, MD  [facility] 07/16/2014, 1:53 PM  Pager #: [phone]   Discharge Exam: Filed Vitals:   07/16/14 1339  BP: 128/76  Pulse: 65  Temp: 98.5 F (36.9 C)  Resp: 20   Filed Vitals:   07/15/14 1333 07/15/14 2043 07/16/14 0608 07/16/14 1339  BP: 114/63 127/60 140/77 128/76  Pulse: 68 73 75 65  Temp: 97.6 F (36.4 C) 97.9 F (36.6 C) 98.2 F (36.8 C) 98.5 F (36.9 C)  TempSrc: Oral Oral Oral Oral  Resp: 20 20 20 20   Height:      Weight:      SpO2: 95% 97% 98% 100%    General: Pt is alert, follows commands appropriately, not in acute distress Cardiovascular: Regular rate and rhythm, S1/S2 appreciated  Respiratory: Clear to auscultation bilaterally, no wheezing, no crackles, no rhonchi Abdominal: Soft, non tender, non distended,  bowel sounds +, no guarding   Discharge Instructions  Discharge Instructions    Call MD for:  difficulty breathing, headache or visual disturbances    Complete by:  As directed      Call MD for:  persistant nausea and vomiting    Complete by:  As directed      Call MD for:  redness, tenderness, or signs of infection (pain, swelling,  redness, odor or green/yellow discharge around incision site)    Complete by:  As directed      Call MD for:  severe uncontrolled pain    Complete by:  As directed      Diet - low sodium heart healthy    Complete by:  As directed      Discharge instructions    Complete by:  As directed   1. Continue macrobid for 5 more days on discharge for urinary tract infection. 2. May continue blood pressure meds but make sure blood pressure 120/80 or above. So far blood pressure is 128/76. 3. Kidney function is within normal limits.     Increase activity slowly    Complete by:  As directed             Medication List    TAKE these medications        acetaminophen 500 MG tablet  Commonly known as:  TYLENOL  Take 1,000 mg by mouth 2 (two) times daily.     cloNIDine 0.1 MG tablet  Commonly known as:  CATAPRES  Take 0.1 mg by mouth daily.     donepezil 5 MG tablet  Commonly known as:  ARICEPT  Take 5 mg by mouth daily with breakfast.     ferrous sulfate 325 (65 FE) MG tablet  Take 325 mg by mouth at bedtime.     furosemide 20 MG tablet  Commonly known as:  LASIX  Take 20 mg by mouth daily with breakfast.     LORazepam 1 MG tablet  Commonly known as:  ATIVAN  Take 1 mg by mouth daily with breakfast.     memantine 10 MG tablet  Commonly known as:  NAMENDA  Take 10 mg by mouth 2 (two) times daily.     multivitamin with minerals Tabs tablet  Take 1 tablet by mouth daily with breakfast.     nitrofurantoin (macrocrystal-monohydrate) 100 MG capsule  Commonly known as:  MACROBID  Take 1 capsule (100 mg total) by mouth 2 (two) times daily.     simvastatin 20 MG tablet  Commonly known as:  ZOCOR  Take 20 mg by mouth daily with breakfast.     STOOL SOFTENER PO  Take 1 capsule by mouth every other day.     VITAMIN C PO  Take 1 tablet by mouth daily.           Follow-up Information    Follow up with [name], MD. Schedule an appointment as soon as possible for a  visit in 1 week.   Specialty:  Internal Medicine   Why:  Follow up appt after recent hospitalization   Contact information:   [address], [address] [place] [address] [phone]        The results of significant diagnostics from this hospitalization (including imaging, microbiology, ancillary and laboratory) are listed below for reference.    Significant Diagnostic Studies: Ct Head Wo Contrast  07/14/2014   CLINICAL DATA:  Altered mental status, agitation.  Prior stroke.  EXAM: CT HEAD WITHOUT  CONTRAST  TECHNIQUE: Contiguous axial images were obtained from the base of the skull through the vertex without intravenous contrast.  COMPARISON:  Brain MRI 09/05/2005, head CT 06/11/2005  FINDINGS: Left occipital encephalomalacia and parietal encephalomalacia reidentified. Stable bilateral external capsule lacunar infarcts. No acute hemorrhage, infarct, or mass lesion is identified. No skull fracture. Left parietal skull probable venous lake reidentified. Soft tissue density material within the external auditory canals is most compatible with cerumen  IMPRESSION: No acute intracranial finding. No change in chronic findings as above.   Electronically Signed   By: Conchita Paris M.D.   On: 07/14/2014 15:22   Dg Chest Port 1 View  07/14/2014   CLINICAL DATA:  Patient has generalized weakness in agitation. History of dementia.  EXAM: PORTABLE CHEST - 1 VIEW  COMPARISON:  09/05/2010 bed 05/26/2006  FINDINGS: Cardiac leads project over chest. Lung volumes are slightly low. There is chronic mild prominence of the bronchial wall markings at the lung bases. There is slight volume loss at the left lung base. No definite airspace disease identified. No visible pleural effusion. Negative for pneumothorax. No acute osseous abnormality identified. Degenerative changes of both acromioclavicular joints.  IMPRESSION: Low lung volumes with mild left basilar atelectasis. Chronic mild prominence of the  bronchial wall markings at the lung bases.   Electronically Signed   By: Curlene Dolphin M.D.   On: 07/14/2014 15:10    Microbiology: No results found for this or any previous visit (from the past 240 hour(s)).   Labs: Basic Metabolic Panel:  Recent Labs Lab 07/14/14 1354 07/15/14 0410  NA 144 139  K 3.9 3.7  CL 106 104  CO2 25 23  GLUCOSE 113* 101*  BUN 16 15  CREATININE 1.24 1.25  CALCIUM 9.0 8.3*   Liver Function Tests:  Recent Labs Lab 07/14/14 1353  AST 22  ALT 12  ALKPHOS 64  BILITOT 0.4  PROT 7.1  ALBUMIN 2.8*   No results for input(s): LIPASE, AMYLASE in the last 168 hours. No results for input(s): AMMONIA in the last 168 hours. CBC:  Recent Labs Lab 07/14/14 1354 07/15/14 0410  WBC 6.3 5.0  NEUTROABS 4.7  --   HGB 11.5* 10.1*  HCT 35.0* 31.0*  MCV 79.7 79.5  PLT 299 266   Cardiac Enzymes: No results for input(s): CKTOTAL, CKMB, CKMBINDEX, TROPONINI in the last 168 hours. BNP: BNP (last 3 results) No results for input(s): PROBNP in the last 8760 hours. CBG: No results for input(s): GLUCAP in the last 168 hours.  Time coordinating discharge: Over 30 minutes

## 2014-07-16 NOTE — Care Management Note (Signed)
    Page 1 of 1   07/16/2014     3:20:17 PM CARE MANAGEMENT NOTE 07/16/2014  Patient:  Mark Yang, Mark Yang   Account Number:  [id number]  Date Initiated:  07/16/2014  Documentation initiated by:  Dessa Phi  Subjective/Objective Assessment:   80 Y/O M ADMITTED W/AMS.     Action/Plan:   FROM HOME.ACTIVE W/[facility]-PRIVATE DUTY CARE.   Anticipated DC Date:  07/16/2014   Anticipated DC Plan:  [place]  CM consult      Choice offered to / List presented to:  C-4 Adult Children        HH arranged  HH-1 RN  [place]      [place].   Status of service:  Completed, signed off Medicare Important Message given?   (If response is "NO", the following Medicare IM given date fields will be blank) Date Medicare IM given:   Medicare IM given by:   Date Additional Medicare IM given:   Additional Medicare IM given by:    Discharge Disposition:  [place]  Per UR Regulation:  Reviewed for med. necessity/level of care/duration of stay  If discussed at Long Length of Stay Meetings, dates discussed:    Comments:  07/16/14 Dorota Heinrichs RN,BSN NCM [phone] [place].TC [facility] AWARE OF REFERRAL & D/C ORDERS.MD PROVIDED W/SCRIPT FOR TRANSPORT CHAIR-DTR WILL TAKE TO RETAIL DME STORE-[facility] [address].NO FURTHER D/C NEEDS.

## 2014-07-16 NOTE — Progress Notes (Signed)
Called family, [phone]; no response; message left about pt being stable for discharge. I left my number so family can reach me in case they have questions or concerns about d/c. Leisa Lenz

## 2014-07-16 NOTE — Evaluation (Signed)
Physical Therapy Evaluation Patient Details Name: Mark Yang MRN: [id number] DOB: Mar 09, 1918 Today's Date: 07/16/2014   History of Present Illness  80 year old male with history of congestive heart failure, dementia, STEMI, HTN, stroke (residual right sided weakness), bladder tumor resection in September, presented to [facility] ED 07/13/14 for evaluation of progressive confusion noted per family members, associated with generalized weakness, poor oral intake. This was noted several days prior to this admissionlikely metabolic encephalopathy due to UTI.  Clinical Impression  Patient able to ambulate a short distance,  Initially required extensive assist but improved with  Continued activity. No family present, RN reported daughter cares for patient. Pt will benefit from PT to address problems  Listed in note below.    Follow Up Recommendations Home health PT;Supervision/Assistance - 24 hour (aide)    Equipment Recommendations  None recommended by PT    Recommendations for Other Services       Precautions / Restrictions Precautions Precautions: Fall Precaution Comments: condom cath      Mobility  Bed Mobility Overal bed mobility: Needs Assistance;+ 2 for safety/equipment Bed Mobility: Supine to Sit     Supine to sit: Max assist;HOB elevated     General bed mobility comments: multimodal cues  for moving legs to edge, support for trunk into upright, extra time, pt moves slowly  Transfers Overall transfer level: Needs assistance Equipment used: Rolling walker (2 wheeled) Transfers: Sit to/from [facility] Sit to Stand: Max assist;+2 safety/equipment;+2 physical assistance Stand pivot transfers: Mod assist       General transfer comment: initial standing required extensive assist. Patient stated"I haven't done this for awaile", Pt required on 1person on second  standing and pivot to recliner.  Ambulation/Gait Ambulation/Gait assistance: +2 safety/equipment;Mod  assist Ambulation Distance (Feet): 20 Feet Assistive device: Rolling walker (2 wheeled) Gait Pattern/deviations: Step-to pattern;Decreased step length - right;Decreased weight shift to right;Decreased weight shift to left;Trunk flexed     General Gait Details: initially required  assist to advance R leg, improved with  subsequent steps. much better steps when pivoted to recliner.  Stairs            Wheelchair Mobility    Modified Rankin (Stroke Patients Only)       Balance Overall balance assessment: Needs assistance Sitting-balance support: Feet supported;Bilateral upper extremity supported Sitting balance-Leahy Scale: Fair   Postural control: Posterior lean;Right lateral lean Standing balance support: Bilateral upper extremity supported Standing balance-Leahy Scale: Poor                               Pertinent Vitals/Pain Pain Assessment: No/denies pain    Home Living Family/patient expects to be discharged to:: Private residence Living Arrangements: Spouse/significant other Available Help at Discharge: Family             Additional Comments: RN stated patient's daughter cares for patient  and his spouse and states daughter has a "bad Back", no family present for discussion of  caregiver availabilty.    Prior Function Level of Independence: Needs assistance   Gait / Transfers Assistance Needed: chart reports minimally ambulatory           Hand Dominance        Extremity/Trunk Assessment   Upper Extremity Assessment: Generalized weakness           Lower Extremity Assessment: Generalized weakness;RLE deficits/detail RLE Deficits / Details: decreased advancement of leg during gait, asssist   to initiate swing,  improved with subsequent steps    Cervical / Trunk Assessment: Kyphotic  Communication      Cognition Arousal/Alertness: Awake/alert Behavior During Therapy: WFL for tasks assessed/performed Overall Cognitive Status: No  family/caregiver present to determine baseline cognitive functioning Area of Impairment: Orientation               General Comments: pt  able to follow cues for mobility    General Comments      Exercises        Assessment/Plan    PT Assessment Patient needs continued PT services  PT Diagnosis Difficulty walking;Generalized weakness;Altered mental status   PT Problem List Decreased strength;Decreased activity tolerance;Decreased mobility;Decreased knowledge of precautions;Decreased knowledge of use of DME  PT Treatment Interventions DME instruction;Gait training;Functional mobility training;Therapeutic activities;Therapeutic exercise;Patient/family education   PT Goals (Current goals can be found in the Care Plan section) Acute Rehab PT Goals Patient Stated Goal: agreed to walk PT Goal Formulation: Patient unable to participate in goal setting Time For Goal Achievement: 07/30/14 Potential to Achieve Goals: Good    Frequency Min 3X/week   Barriers to discharge        Co-evaluation               End of Session   Activity Tolerance: Patient tolerated treatment well Patient left: in chair;with call bell/phone within reach;with chair alarm set Nurse Communication: Mobility status         Time: 0175-1025 PT Time Calculation (min) (ACUTE ONLY): 20 min   Charges:   PT Evaluation $Initial PT Evaluation Tier I: 1 Procedure PT Treatments $Gait Training: 8-22 mins   PT G Codes:          Claretha Cooper 07/16/2014, 1:53 PM Tresa Endo PT [phone]

## 2014-07-16 NOTE — Discharge Instructions (Signed)

## 2014-07-16 NOTE — Plan of Care (Signed)
Problem: Phase II Progression Outcomes Goal: Progress activity as tolerated unless otherwise ordered Outcome: Completed/Met Date Met:  07/16/14 Goal: Vital signs remain stable, temperature < 100 Outcome: Completed/Met Date Met:  07/16/14 Goal: Tolerating diet Outcome: Completed/Met Date Met:  07/16/14 Goal: Discharge plan established Outcome: Completed/Met Date Met:  07/16/14 Goal: Voiding independently Outcome: Completed/Met Date Met:  07/16/14 Goal: Other Phase II Outcomes/Goals Outcome: Adequate for Discharge  Problem: Phase III Progression Outcomes Goal: Pain controlled on oral analgesia Outcome: Completed/Met Date Met:  07/16/14 Goal: Activity at appropriate level-compared to baseline (UP IN CHAIR FOR HEMODIALYSIS)  Outcome: Completed/Met Date Met:  07/16/14 Goal: Afebrile, Vital Signs remain stable Outcome: Completed/Met Date Met:  07/16/14 Goal: Tolerating diet Outcome: Completed/Met Date Met:  07/16/14 Goal: IV Medications to PO Outcome: Completed/Met Date Met:  07/16/14 Goal: Discharge plan remains appropriate-arrangements made Outcome: Completed/Met Date Met:  07/16/14 Goal: Other Phase III Outcomes/Goals Outcome: Completed/Met Date Met:  07/16/14

## 2014-07-16 NOTE — Plan of Care (Signed)
Problem: Phase II Progression Outcomes Goal: Other Phase II Outcomes/Goals Outcome: Completed/Met Date Met:  2014/02/27

## 2014-07-16 NOTE — Progress Notes (Signed)
Pt discharged home via [facility] in stable condition. Discharge instructions and scripts given to daughter Bonnita Nasuti. She voiced understanding

## 2016-04-18 ENCOUNTER — Emergency Department (HOSPITAL_COMMUNITY)
Admission: EM | Admit: 2016-04-18 | Discharge: 2016-04-18 | Disposition: A | Discharge status: Home / Self Care | Payer: No Typology Code available for payment source | Attending: Emergency Medicine | Admitting: Emergency Medicine

## 2016-04-18 ENCOUNTER — Encounter (HOSPITAL_COMMUNITY): Payer: Self-pay | Admitting: Emergency Medicine

## 2016-04-18 ENCOUNTER — Emergency Department (HOSPITAL_COMMUNITY): Payer: No Typology Code available for payment source

## 2016-04-18 DIAGNOSIS — R531 Weakness: Secondary | ICD-10-CM | POA: Insufficient documentation

## 2016-04-18 DIAGNOSIS — Z8551 Personal history of malignant neoplasm of bladder: Secondary | ICD-10-CM | POA: Insufficient documentation

## 2016-04-18 DIAGNOSIS — Z8546 Personal history of malignant neoplasm of prostate: Secondary | ICD-10-CM | POA: Diagnosis not present

## 2016-04-18 DIAGNOSIS — I110 Hypertensive heart disease with heart failure: Secondary | ICD-10-CM | POA: Diagnosis not present

## 2016-04-18 DIAGNOSIS — Z87891 Personal history of nicotine dependence: Secondary | ICD-10-CM | POA: Diagnosis not present

## 2016-04-18 DIAGNOSIS — Z79899 Other long term (current) drug therapy: Secondary | ICD-10-CM | POA: Diagnosis not present

## 2016-04-18 DIAGNOSIS — I5032 Chronic diastolic (congestive) heart failure: Secondary | ICD-10-CM | POA: Diagnosis not present

## 2016-04-18 LAB — URINE MICROSCOPIC-ADD ON

## 2016-04-18 LAB — BASIC METABOLIC PANEL
Anion gap: 8 (ref 5–15)
BUN: 23 mg/dL — ABNORMAL HIGH (ref 6–20)
CO2: 28 mmol/L (ref 22–32)
Calcium: 9.1 mg/dL (ref 8.9–10.3)
Chloride: 108 mmol/L (ref 101–111)
Creatinine, Ser: 1.26 mg/dL — ABNORMAL HIGH (ref 0.61–1.24)
GFR calc Af Amer: 54 mL/min — ABNORMAL LOW (ref >60)
GFR calc non Af Amer: 47 mL/min — ABNORMAL LOW (ref >60)
Glucose, Bld: 88 mg/dL (ref 65–99)
Potassium: 3.6 mmol/L (ref 3.5–5.1)
Sodium: 144 mmol/L (ref 135–145)

## 2016-04-18 LAB — URINALYSIS, ROUTINE W REFLEX MICROSCOPIC
Bilirubin Urine: NEGATIVE
Glucose, UA: NEGATIVE mg/dL
Ketones, ur: NEGATIVE mg/dL
Nitrite: NEGATIVE
Protein, ur: NEGATIVE mg/dL
Specific Gravity, Urine: 1.009 (ref 1.005–1.030)
pH: 5.5 (ref 5.0–8.0)

## 2016-04-18 LAB — CBC
HCT: 40.6 % (ref 39.0–52.0)
Hemoglobin: 13.3 g/dL (ref 13.0–17.0)
MCH: 29.8 pg (ref 26.0–34.0)
MCHC: 32.8 g/dL (ref 30.0–36.0)
MCV: 91 fL (ref 78.0–100.0)
Platelets: 218 10*3/uL (ref 150–400)
RBC: 4.46 MIL/uL (ref 4.22–5.81)
RDW: 15.9 % — ABNORMAL HIGH (ref 11.5–15.5)
WBC: 6.8 10*3/uL (ref 4.0–10.5)

## 2016-04-18 LAB — CBG MONITORING, ED: Glucose-Capillary: 87 mg/dL (ref 65–99)

## 2016-04-18 NOTE — ED Triage Notes (Signed)
Per EMS patient from home.  Family called EMS for possible stroke.  Patient has hx of stroke from 2005.  Patient denies any complaints.  Family states "since yesterday he has been off".  Generalized weakness-more blood noted in urine than normal.  Per family patient acts like this when he has UTI.

## 2016-04-18 NOTE — ED Triage Notes (Signed)
In addition to below note, family states that patient has been moving more slowly and leaning more towards the left onset last night, which is not his usual. Also reports increase in blood in urine and increased confusion, which has correlated with UTIs in the past. Patient has blood in urine s/t bladder tumor.

## 2016-04-18 NOTE — ED Notes (Signed)
Bed: WA08 Expected date:  Expected time:  Means of arrival:  Comments: Hold RM 8 for UTI

## 2016-04-18 NOTE — ED Notes (Signed)
Patient transported to X-ray 

## 2016-04-18 NOTE — Discharge Instructions (Signed)
Please monitor for any changes, return immediately if any present. Please follow-up with primary care provider for reevaluation further management.

## 2016-04-18 NOTE — ED Provider Notes (Signed)
[facility] DEPT Provider Note   CSN: [id number] Arrival date & time: 04/18/16  1422     History   Chief Complaint Chief Complaint  Patient presents with  . Weakness    HPI Mark Yang is a 82 y.o. male.  HPI   Level V due to dementia  82 year old male presents today with his daughter who is his power of attorney. She reports that on Wednesday patient has had intermittent subjective low-grade fevers, non-consistent. She reports that he has been acting slightly different from his baseline. They report this morning he was slightly more confused than normal, but not completely altered. They report that he has been slightly leaning to the right at times, but still ambulates on his own. They state that he has been eating and drinking appropriately, denying any complaints of pain, no episodes of vomiting, no rash, no other complaints. Patient has a history of stroke with vision loss, history of heart attack in 1997. Patient also has history of bladder cyst, seen by Dr. Cecille Rubin with intermittent episodes of bleeding. Also noting that over the last 2 weeks he said very minor cough and runny nose off and on.   Past Medical History:  Diagnosis Date  . Cancer ([place])    hx prostate cancer  . CHF (congestive heart failure) ([place])   . Dyslipidemia   . Hyperhomocysteinemia ([place])   . Hypertension   . NSTEMI (non-ST elevated myocardial infarction) ([place])   . Stroke [facility])     Patient Active Problem List   Diagnosis Date Noted  . Altered mental state 07/14/2014  . HTN (hypertension) 07/14/2014  . Dementia 07/14/2014  . Diastolic CHF, chronic ([place]) 07/14/2014  . UTI (lower urinary tract infection)   . Malignant neoplasm of urinary bladder neck ([place]) 05/22/2014  . Hematuria 05/20/2014    Past Surgical History:  Procedure Laterality Date  . CYSTO N/A 05/21/2014   Procedure: CYSTO;  Surgeon: Bernestine Amass, MD;  Location: [facility] ORS;  Service: Urology;  Laterality: N/A;  . prostate seed  implants    . TRANSURETHRAL RESECTION OF BLADDER TUMOR WITH GYRUS (TURBT-GYRUS) N/A 05/21/2014   Procedure: TRANSURETHRAL RESECTION OF BLADDER TUMOR WITH GYRUS WITH CLOT EVACUATION;  Surgeon: Bernestine Amass, MD;  Location: [facility] ORS;  Service: Urology;  Laterality: N/A;     Home Medications    Prior to Admission medications   Medication Sig Start Date End Date Taking? Authorizing Provider  acetaminophen (TYLENOL) 500 MG tablet Take 1,000 mg by mouth 2 (two) times daily.   Yes Historical Provider, MD  Ascorbic Acid (VITAMIN C PO) Take 1 tablet by mouth daily.   Yes Historical Provider, MD  cloNIDine (CATAPRES) 0.1 MG tablet Take 0.1 mg by mouth daily.   Yes Historical Provider, MD  docusate sodium (COLACE) 100 MG capsule Take 100 mg by mouth daily as needed for mild constipation.   Yes Historical Provider, MD  donepezil (ARICEPT) 5 MG tablet Take 5 mg by mouth daily with breakfast.    Yes Historical Provider, MD  ferrous sulfate 325 (65 FE) MG tablet Take 325 mg by mouth at bedtime.    Yes Historical Provider, MD  furosemide (LASIX) 20 MG tablet Take 1 tablet (20 mg total) by mouth daily with breakfast. 07/16/14  Yes Robbie Lis, MD  memantine (NAMENDA) 10 MG tablet Take 10 mg by mouth 2 (two) times daily.   Yes Historical Provider, MD  Multiple Vitamin (MULTIVITAMIN WITH MINERALS) TABS tablet Take 1 tablet by mouth  daily with breakfast.    Yes Historical Provider, MD  simvastatin (ZOCOR) 20 MG tablet Take 20 mg by mouth every evening.    Yes Historical Provider, MD  Misc. Devices [facility]) MISC 1 each by Does not apply route once. 07/16/14   Robbie Lis, MD    Family History History reviewed. No pertinent family history.  Social History Social History  Substance Use Topics  . Smoking status: Former Smoker    Years: 20.00    Types: Cigarettes    Quit date: 07/14/1993  . Smokeless tobacco: Never Used  . Alcohol use No     Allergies   Review of patient's allergies indicates no  known allergies.   Review of Systems Review of Systems  All other systems reviewed and are negative.    Physical Exam Updated Vital Signs BP (!) 154/101   Pulse 65   Temp 98.2 F (36.8 C) (Oral)   Resp 17   SpO2 99%   Physical Exam  Constitutional: He appears well-developed and well-nourished.  HENT:  Head: Normocephalic and atraumatic.  Eyes: Conjunctivae are normal. Pupils are equal, round, and reactive to light. Right eye exhibits no discharge. Left eye exhibits no discharge. No scleral icterus.  Neck: Normal range of motion. No JVD present. No tracheal deviation present.  Cardiovascular: Normal rate and regular rhythm.   Murmur heard. Pulmonary/Chest: Effort normal. No stridor. No respiratory distress. He has no wheezes. He has no rales. He exhibits no tenderness.  Abdominal: Soft. He exhibits no distension. There is no tenderness.  Musculoskeletal: Normal range of motion.  Neurological: He is alert. Coordination normal.  Mental status baseline per family  Psychiatric: He has a normal mood and affect. His behavior is normal. Judgment and thought content normal.  Nursing note and vitals reviewed.      ED Treatments / Results  Labs (all labs ordered are listed, but only abnormal results are displayed) Labs Reviewed  URINALYSIS, ROUTINE W REFLEX MICROSCOPIC (NOT AT [facility]) - Abnormal; Notable for the following:       Result Value   Color, Urine RED (*)    APPearance CLOUDY (*)    Hgb urine dipstick LARGE (*)    Leukocytes, UA TRACE (*)    All other components within normal limits  BASIC METABOLIC PANEL - Abnormal; Notable for the following:    BUN 23 (*)    Creatinine, Ser 1.26 (*)    GFR calc non Af Amer 47 (*)    GFR calc Af Amer 54 (*)    All other components within normal limits  CBC - Abnormal; Notable for the following:    RDW 15.9 (*)    All other components within normal limits  URINE MICROSCOPIC-ADD ON - Abnormal; Notable for the following:     Squamous Epithelial / LPF 0-5 (*)    Bacteria, UA FEW (*)    All other components within normal limits  CBG MONITORING, ED    EKG  EKG Interpretation None       Radiology Dg Chest 2 View  Result Date: 04/18/2016 CLINICAL DATA:  Generalized weakness. Personal history of congestive heart failure. EXAM: CHEST  2 VIEW COMPARISON:  One-view chest x-ray 07/14/2014 FINDINGS: Heart size is normal. Atherosclerotic calcifications are present at the arch. Bibasilar airspace disease likely reflects atelectasis. Lung volumes are low. Scarring at the apices is again seen. There is no edema or effusion. The visualized soft tissues and bony thorax are unremarkable. IMPRESSION: 1. No acute cardiopulmonary disease  or significant interval change. 2. Bibasilar airspace disease is similar to the prior exam, likely reflecting atelectasis or scarring. Electronically Signed   By: San Morelle M.D.   On: 04/18/2016 16:13    Procedures Procedures (including critical care time)  Medications Ordered in ED Medications - No data to display   Initial Impression / Assessment and Plan / ED Course  I have reviewed the triage vital signs and the nursing notes.  Pertinent labs & imaging results that were available during my care of the patient were reviewed by me and considered in my medical decision making (see chart for details).  Clinical Course     Final Clinical Impressions(s) / ED Diagnoses   Final diagnoses:  Weakness    Labs: BMP, CBC, CBG, Urinalysis   Imaging:  Consults:  Therapeutics:  Discharge Meds:   Assessment/Plan:82 year old male presents today with family's complaints of weakness. Patient appears well, is at baseline throughout my evaluation. He has no focal deficits on my exam, he has reassuring vital signs, reassuring laboratory analysis. Uncertain etiology of patient's weakness, likely progressive decline. No findings necessitate further evaluation or management here in  the ED, patient will be discharged home with primary care follow-up and strict return precautions. Patient's daughters were in agreement today's plan and happy to receive discharge home.    New Prescriptions New Prescriptions   No medications on file     Okey Regal, PA-C 04/18/16 [place], PA-C 04/18/16 1742    Virgel Manifold, MD 04/20/16 [phone]

## 2016-10-30 DEATH — deceased
